# Patient Record
Sex: Female | Born: 1970 | Race: Black or African American | Hispanic: No | Marital: Married | Smoking: Former smoker
Health system: Southern US, Community
[De-identification: ages and names within clinical notes are randomized; demographics above are authoritative.]

## PROBLEM LIST (undated history)

## (undated) DIAGNOSIS — E669 Obesity, unspecified: Secondary | ICD-10-CM

## (undated) DIAGNOSIS — R030 Elevated blood-pressure reading, without diagnosis of hypertension: Secondary | ICD-10-CM

## (undated) DIAGNOSIS — T7840XA Allergy, unspecified, initial encounter: Secondary | ICD-10-CM

## (undated) DIAGNOSIS — F419 Anxiety disorder, unspecified: Secondary | ICD-10-CM

## (undated) DIAGNOSIS — R011 Cardiac murmur, unspecified: Secondary | ICD-10-CM

## (undated) DIAGNOSIS — Z973 Presence of spectacles and contact lenses: Secondary | ICD-10-CM

## (undated) DIAGNOSIS — D219 Benign neoplasm of connective and other soft tissue, unspecified: Secondary | ICD-10-CM

## (undated) HISTORY — PX: ABDOMINAL HYSTERECTOMY: SHX81

## (undated) HISTORY — DX: Elevated blood-pressure reading, without diagnosis of hypertension: R03.0

## (undated) HISTORY — DX: Allergy, unspecified, initial encounter: T78.40XA

## (undated) HISTORY — DX: Anxiety disorder, unspecified: F41.9

## (undated) HISTORY — PX: POLYPECTOMY: SHX149

## (undated) HISTORY — PX: COLONOSCOPY: SHX174

## (undated) HISTORY — DX: Obesity, unspecified: E66.9

## (undated) HISTORY — PX: TONSILLECTOMY AND ADENOIDECTOMY: SUR1326

---

## 1997-08-05 ENCOUNTER — Emergency Department (HOSPITAL_COMMUNITY): Admission: EM | Admit: 1997-08-05 | Discharge: 1997-08-05 | Payer: Self-pay | Admitting: Emergency Medicine

## 1998-01-13 ENCOUNTER — Encounter: Admission: RE | Admit: 1998-01-13 | Discharge: 1998-01-13 | Payer: Self-pay | Admitting: Family Medicine

## 1998-06-13 ENCOUNTER — Encounter: Admission: RE | Admit: 1998-06-13 | Discharge: 1998-06-13 | Payer: Self-pay | Admitting: Family Medicine

## 1998-12-19 ENCOUNTER — Encounter: Admission: RE | Admit: 1998-12-19 | Discharge: 1998-12-19 | Payer: Self-pay | Admitting: Family Medicine

## 1999-01-23 ENCOUNTER — Encounter: Admission: RE | Admit: 1999-01-23 | Discharge: 1999-01-23 | Payer: Self-pay | Admitting: Family Medicine

## 1999-11-16 ENCOUNTER — Encounter: Admission: RE | Admit: 1999-11-16 | Discharge: 1999-11-16 | Payer: Self-pay | Admitting: Family Medicine

## 1999-12-11 ENCOUNTER — Encounter (INDEPENDENT_AMBULATORY_CARE_PROVIDER_SITE_OTHER): Payer: Self-pay | Admitting: *Deleted

## 1999-12-11 LAB — CONVERTED CEMR LAB

## 2001-10-14 ENCOUNTER — Other Ambulatory Visit: Admission: RE | Admit: 2001-10-14 | Discharge: 2001-10-14 | Payer: Self-pay | Admitting: Obstetrics and Gynecology

## 2001-12-02 ENCOUNTER — Emergency Department (HOSPITAL_COMMUNITY): Admission: EM | Admit: 2001-12-02 | Discharge: 2001-12-02 | Payer: Self-pay | Admitting: *Deleted

## 2001-12-02 ENCOUNTER — Encounter: Payer: Self-pay | Admitting: *Deleted

## 2002-04-01 ENCOUNTER — Encounter: Admission: RE | Admit: 2002-04-01 | Discharge: 2002-04-01 | Payer: Self-pay | Admitting: Family Medicine

## 2003-01-26 ENCOUNTER — Other Ambulatory Visit: Admission: RE | Admit: 2003-01-26 | Discharge: 2003-01-26 | Payer: Self-pay | Admitting: Obstetrics and Gynecology

## 2003-03-31 ENCOUNTER — Encounter: Admission: RE | Admit: 2003-03-31 | Discharge: 2003-03-31 | Payer: Self-pay | Admitting: Family Medicine

## 2004-06-26 ENCOUNTER — Ambulatory Visit: Payer: Self-pay | Admitting: Family Medicine

## 2004-06-26 ENCOUNTER — Inpatient Hospital Stay (HOSPITAL_COMMUNITY): Admission: AD | Admit: 2004-06-26 | Discharge: 2004-06-26 | Payer: Self-pay | Admitting: Obstetrics and Gynecology

## 2005-07-17 ENCOUNTER — Other Ambulatory Visit: Admission: RE | Admit: 2005-07-17 | Discharge: 2005-07-17 | Payer: Self-pay | Admitting: Obstetrics and Gynecology

## 2005-09-30 ENCOUNTER — Emergency Department (HOSPITAL_COMMUNITY): Admission: EM | Admit: 2005-09-30 | Discharge: 2005-09-30 | Payer: Self-pay | Admitting: Family Medicine

## 2006-05-09 DIAGNOSIS — L732 Hidradenitis suppurativa: Secondary | ICD-10-CM | POA: Insufficient documentation

## 2006-05-09 DIAGNOSIS — F17200 Nicotine dependence, unspecified, uncomplicated: Secondary | ICD-10-CM | POA: Insufficient documentation

## 2006-05-10 ENCOUNTER — Encounter (INDEPENDENT_AMBULATORY_CARE_PROVIDER_SITE_OTHER): Payer: Self-pay | Admitting: *Deleted

## 2006-10-02 ENCOUNTER — Other Ambulatory Visit: Admission: RE | Admit: 2006-10-02 | Discharge: 2006-10-02 | Payer: Self-pay | Admitting: Obstetrics and Gynecology

## 2008-01-20 ENCOUNTER — Ambulatory Visit: Payer: Self-pay | Admitting: Family Medicine

## 2008-01-20 ENCOUNTER — Telehealth (INDEPENDENT_AMBULATORY_CARE_PROVIDER_SITE_OTHER): Payer: Self-pay | Admitting: *Deleted

## 2008-01-20 DIAGNOSIS — R030 Elevated blood-pressure reading, without diagnosis of hypertension: Secondary | ICD-10-CM | POA: Insufficient documentation

## 2008-01-20 LAB — CONVERTED CEMR LAB
Blood in Urine, dipstick: NEGATIVE
Hemoglobin: 12.7 g/dL
Ketones, urine, test strip: NEGATIVE
Nitrite: NEGATIVE
Specific Gravity, Urine: 1.025
pH: 6

## 2008-02-24 ENCOUNTER — Ambulatory Visit: Payer: Self-pay | Admitting: Family Medicine

## 2008-02-24 DIAGNOSIS — E669 Obesity, unspecified: Secondary | ICD-10-CM | POA: Insufficient documentation

## 2008-05-04 ENCOUNTER — Telehealth (INDEPENDENT_AMBULATORY_CARE_PROVIDER_SITE_OTHER): Payer: Self-pay | Admitting: *Deleted

## 2008-05-20 ENCOUNTER — Ambulatory Visit: Payer: Self-pay | Admitting: Gastroenterology

## 2008-05-24 ENCOUNTER — Telehealth: Payer: Self-pay | Admitting: Gastroenterology

## 2008-05-26 ENCOUNTER — Ambulatory Visit: Payer: Self-pay | Admitting: Gastroenterology

## 2008-05-26 ENCOUNTER — Encounter: Payer: Self-pay | Admitting: Gastroenterology

## 2008-05-26 ENCOUNTER — Ambulatory Visit (HOSPITAL_COMMUNITY): Admission: RE | Admit: 2008-05-26 | Discharge: 2008-05-26 | Payer: Self-pay | Admitting: Gastroenterology

## 2008-05-27 ENCOUNTER — Encounter: Payer: Self-pay | Admitting: Gastroenterology

## 2008-06-14 ENCOUNTER — Ambulatory Visit: Payer: Self-pay | Admitting: Family Medicine

## 2008-06-14 ENCOUNTER — Encounter (INDEPENDENT_AMBULATORY_CARE_PROVIDER_SITE_OTHER): Payer: Self-pay | Admitting: Family Medicine

## 2008-06-14 LAB — CONVERTED CEMR LAB
HIV-1 antibody: NEGATIVE
HIV-2 Ab: NEGATIVE

## 2008-06-18 ENCOUNTER — Encounter (INDEPENDENT_AMBULATORY_CARE_PROVIDER_SITE_OTHER): Payer: Self-pay | Admitting: Family Medicine

## 2010-01-16 ENCOUNTER — Encounter: Payer: Self-pay | Admitting: Sports Medicine

## 2010-04-11 NOTE — Miscellaneous (Signed)
  Clinical Lists Changes  Problems: Removed problem of VAGINAL DISCHARGE (ICD-623.5) Removed problem of SCREENING FOR MALIGNANT NEOPLASM OF THE CERVIX (ICD-V76.2) Removed problem of CONSTIPATION (ICD-564.00) Removed problem of BLOOD IN STOOL (ICD-578.1) Removed problem of RECTAL BLEEDING (ICD-569.3) Removed problem of URINARY FREQUENCY (ICD-788.41)

## 2010-06-01 ENCOUNTER — Encounter: Payer: Self-pay | Admitting: Gastroenterology

## 2010-06-08 NOTE — Letter (Signed)
Summary: Colonoscopy Letter  [facility]  [address] [place], [place] [address]   Phone: [phone]  Fax: [phone]      June 01, 2010 MRN: [id number]   Jenny Morales [address] [place], [place]  [address]   Dear Ms. Janco,   According to your medical record, it is time for you to schedule a Colonoscopy. The American Cancer Society recommends this procedure as a method to detect early colon cancer. Patients with a family history of colon cancer, or a personal history of colon polyps or inflammatory bowel disease are at increased risk.  This letter has been generated based on the recommendations made at the time of your procedure. If you feel that in your particular situation this may no longer apply, please contact our office.  Please call our office at [phone] to schedule this appointment or to update your records at your earliest convenience.  Thank you for cooperating with [place] to provide you with the very best care possible.   Sincerely,  Judie Petit T. Russella Dar, M.D.  [facility] [phone]

## 2010-08-19 ENCOUNTER — Ambulatory Visit (INDEPENDENT_AMBULATORY_CARE_PROVIDER_SITE_OTHER): Payer: Self-pay

## 2010-08-19 ENCOUNTER — Inpatient Hospital Stay (INDEPENDENT_AMBULATORY_CARE_PROVIDER_SITE_OTHER)
Admission: RE | Admit: 2010-08-19 | Discharge: 2010-08-19 | Disposition: A | Discharge status: Home / Self Care | Payer: Self-pay | Source: Ambulatory Visit | Attending: Emergency Medicine | Admitting: Emergency Medicine

## 2010-08-19 DIAGNOSIS — M25469 Effusion, unspecified knee: Secondary | ICD-10-CM

## 2011-02-14 ENCOUNTER — Encounter: Payer: Self-pay | Admitting: Family Medicine

## 2011-04-03 ENCOUNTER — Ambulatory Visit (INDEPENDENT_AMBULATORY_CARE_PROVIDER_SITE_OTHER): Payer: PRIVATE HEALTH INSURANCE | Admitting: Family Medicine

## 2011-04-03 ENCOUNTER — Encounter: Payer: Self-pay | Admitting: Family Medicine

## 2011-04-03 VITALS — BP 122/82 | HR 66 | Temp 97.9°F | Ht 66.0 in | Wt 245.2 lb

## 2011-04-03 DIAGNOSIS — H1130 Conjunctival hemorrhage, unspecified eye: Secondary | ICD-10-CM

## 2011-04-03 DIAGNOSIS — H1132 Conjunctival hemorrhage, left eye: Secondary | ICD-10-CM

## 2011-04-03 HISTORY — DX: Conjunctival hemorrhage, left eye: H11.32

## 2011-04-03 NOTE — Assessment & Plan Note (Signed)
Likely related to eye strain. No inciting events. She states she does have a new prescription but has not yet purchased a new glasses. She also states she is suffering from increased stress from holidays in the past few weeks. If her information regarding subconjunctival hemorrhage and also reasons to followup if condition worsening or not improving.   Reassured her that her blood pressure is normal and that this was likely not cause for her hemorrhage.

## 2011-04-03 NOTE — Progress Notes (Signed)
  Subjective:    Patient ID: Jenny Morales, female    DOB: 1970-07-14, 41 y.o.   MRN: [id number]  HPI #1. Blood shot left eye: Patient awoke 2 days ago with bloodshot portion of lateral aspect of left eye. This happened to her also 3 weeks ago in the past. She denies actual eye pain but does state she has a little dryness over the bloodshot area. No changes in vision. No blurred vision. She denies any recent coughing, vomiting. No recent illnesses. No actual trauma to the area. When this occurred before it resolved after 2-3 days. She is most worried that she is having episode of high blood pressure and this has caused the bleeding in her eye.   Review of Systems See HPI above for review of systems.       Objective:   Physical Exam  Gen:  Alert, cooperative patient who appears stated age in no acute distress.  Vital signs reviewed. Eyes:  Right eye WNL.  Left eye with area of subconjunctival hemorrhage noted lateral aspect of Left eye in sclera, does not extend into iris or pupil.  PERRL, EOMI.  No hyphema      Assessment & Plan:

## 2011-04-03 NOTE — Patient Instructions (Signed)
Subconjunctival Hemorrhage A subconjunctival hemorrhage is a bright red patch covering a portion of the white of the eye. The white part of the eye is called the sclera, and it is covered by a thin membrane called the conjunctiva. This membrane is clear, except for tiny blood vessels that you can see with the naked eye. When your eye is irritated or inflamed and becomes red, it is because the vessels in the conjunctiva are swollen. Sometimes, a blood vessel in the conjunctiva can break and bleed. When this occurs, the blood builds up between the conjunctiva and the sclera, and spreads out to create a red area. The red spot may be very small at first. It may then spread to cover a larger part of the surface of the eye, or even all of the visible white part of the eye. In almost all cases, the blood will go away and the eye will become white again. Before completely dissolving, however, the red area may spread. It may also become brownish-yellow in color, before going away. If a lot of blood collects under the conjunctiva, it may look like a bulge on the surface of the eye. This looks scary, but it will also eventually flatten out and go away. Subconjunctival hemorrhages do not cause pain, but if swollen, may cause a feeling of irritation. There is no effect on vision.  CAUSES   The most common cause is mild trauma (rubbing the eye, irritation).   Subconjunctival hemorrhages can happen because of coughing or straining (lifting heavy objects), vomiting, or sneezing.   In some cases, your doctor may want to check your blood pressure. High blood pressure can also cause a sunconjunctival hemorrhage.   Severe trauma or blunt injuries.   Diseases that affect blood clotting (hemophilia, leukemia).   Abnormalities of blood vessels behind the eye (carotid cavernous sinus fistula).   Tumors behind the eye.   Certain drugs (aspirin, coumadin, heparin).   Recent eye surgery.  HOME CARE INSTRUCTIONS   Do  not worry about the appearance of your eye. You may continue your usual activities.   Often, follow-up is not necessary.  SEEK MEDICAL CARE IF:   Your eye becomes painful.   The bleeding does not disappear within 3 weeks.   Bleeding occurs elsewhere, for example, under the skin, in the mouth, or in the other eye.   You have recurring subconjunctival hemorrhages.  SEEK IMMEDIATE MEDICAL CARE IF:   Your vision changes or you have difficulty seeing.   You develop severe headache, persistent vomiting, confusion, or abnormal drowsiness (lethargy).   Your eye seems to bulge or protrude from the eye socket.   You notice the sudden appearance of bruises, or have spontaneous bleeding elsewhere on your body.  Document Released: 02/26/2005 Document Revised: 11/08/2010 Document Reviewed: 01/24/2009 ExitCare Patient Information 2012 [employer]. 

## 2011-11-28 ENCOUNTER — Encounter: Payer: Self-pay | Admitting: Gastroenterology

## 2013-03-30 ENCOUNTER — Ambulatory Visit (INDEPENDENT_AMBULATORY_CARE_PROVIDER_SITE_OTHER): Payer: BC Managed Care – PPO | Admitting: Family Medicine

## 2013-03-30 ENCOUNTER — Encounter: Payer: Self-pay | Admitting: Family Medicine

## 2013-03-30 VITALS — BP 133/80 | HR 66 | Temp 98.2°F | Ht 66.0 in | Wt 246.0 lb

## 2013-03-30 DIAGNOSIS — R519 Headache, unspecified: Secondary | ICD-10-CM | POA: Insufficient documentation

## 2013-03-30 DIAGNOSIS — R51 Headache: Secondary | ICD-10-CM

## 2013-03-30 NOTE — Patient Instructions (Addendum)
Jenny Morales,  Thank you for coming in today. Your blood pressure is normal in the office. Your neurological exam is also normal.  For your headache, drink plenty of water today. Avoid salt. You may try a dose or two of aleve.  To maintain a normal blood pressure  I recommend continued weight loss and a low salt diet.   F/u with Dr. Awanda Mink for a yearly physical at your convenience.   Dr. Adrian Blackwater   1.5 Gram Low Sodium Diet A 1.5 gram sodium diet restricts the amount of salt in your diet. You can have no more than 1.5 grams (1500 miligrams) in 1 day. This can help lessen your risk for developing high blood pressure. This diet may also reduce your chance of having a heart attack or stroke. It is important that you know what to look for when choosing foods and drinks.  HOME CARE   Do not add salt to food.  Avoid convenience items and fast food.  Choose unsalted snack foods.  Buy products labeled "low sodium" or "no salt added" when possible.  Check food labels to learn how much sodium is in 1 serving.  When eating at a restaurant, ask that your food be prepared with less salt or none, if possible. The nutrition facts label is a good place to find how much sodium is in foods. Look for products with no more than 400 mg of sodium per serving. Remember that 1.5 g = 1500 mg. CHOOSING FOODS Grains  Avoid: Salted crackers and snack items. Some cereals, including instant hot cereals. Bread stuffing and biscuit mixes. Seasoned rice or pasta mixes.  Choose: Unsalted snack items. Low-sodium cereals, oats, puffed wheat and rice, shredded wheat. English muffins and bread. Pasta. Meats  Avoid:  Salted, canned, smoked, spiced, pickled meats, including fish and poultry. Bacon, ham, sausage, cold cuts, hot dogs, anchovies.  Choose: Low-sodium canned tuna and salmon. Fresh or frozen meat, poultry, and fish. Dairy  Avoid: Processed cheese and spreads. Cottage cheese. Buttermilk and condensed milk.  Regular cheese.  Choose:  Milk. Low-sodium cottage cheese. Yogurt. Sour cream. Low-sodium cheese. Fruits and Vegetables  Avoid:  Regular canned vegetables. Regular canned tomato sauce and paste. Frozen vegetables in sauces. Olives. [name]. Relishes. Sauerkraut.  Choose:  Low-sodium canned vegetables. Low-sodium tomato sauce and paste. Frozen or fresh vegetables. Fresh and frozen fruit. Condiments  Avoid:  Canned and packaged gravies. Worcestershire sauce. Tartar sauce. Barbecue sauce. Soy sauce. Steak sauce. Ketchup. Onion, garlic, and table salt. Meat flavorings and tenderizers.  Choose:  Fresh and dried herbs and spices. Low-sodium varieties of mustard and ketchup. Lemon juice. Tabasco sauce. Horseradish. Document Released: 03/31/2010 Document Revised: 05/21/2011 Document Reviewed: 03/31/2010 [facility] Patient Information 2014 [place], [place].

## 2013-03-30 NOTE — Progress Notes (Addendum)
   Subjective:    Patient ID: Jenny Morales, female    DOB: [birth year], 43 y.o.   MRN: [id number]  HPI 43 yo F presents for same day visit:  1. Headache: woke up yesterday with HA. B/l temple, throbbing and tight. No head trauma. Drank excessively 3 nights ago. Pain was better after drinking water yesterday. Pain worsened after eating gumbo. Patient is not prone to HA. Took Goody powder x 2 w/o relief. Denies fever, vision change, nausea.   Admits to feeling a little dizzy (like swaying) and checked BP at [facility] today and it was 155/103.   Soc Hx: ex-smoker.  Fam Hx: patient mother passed away last year, age 34, multiple myeloma, since then patient worries about her own health.  Review of Systems As per HPI     Objective:   Physical Exam BP 133/80  Pulse 66  Temp(Src) 98.2 F (36.8 C) (Oral)  Ht $R'5\' 6"'Mc$  (1.676 m)  Wt 246 lb (111.585 kg)  BMI 39.72 kg/m2 BP Readings from Last 3 Encounters:  03/30/13 133/80  04/03/11 122/82  06/14/08 123/79   Wt Readings from Last 3 Encounters:  03/30/13 246 lb (111.585 kg)  04/03/11 245 lb 3.2 oz (111.222 kg)  06/14/08 235 lb 14.4 oz (107.004 kg)    General appearance: alert, cooperative, no distress and moderately obese Head: Normocephalic, without obvious abnormality, atraumatic Eyes: conjunctivae/corneas clear. PERRL, EOM's intact. Fundi benign. Ears: normal TM's and external ear canals both ears, moderate cerumen irrigated today  Lungs: clear to auscultation bilaterally Heart: regular rate and rhythm, S1, S2 normal, no murmur, click, rub or gallop Neurologic: Alert and oriented X 3, normal strength and tone. Normal symmetric reflexes. Normal coordination and gait      Assessment & Plan:

## 2013-03-30 NOTE — Assessment & Plan Note (Addendum)
A: exam consistent with tension HA brought on by excessive alcohol intake resulting in dehydration, exacerbated by salty food. Normal in office BP.  P:  Your blood pressure is normal in the office. Your neurological exam is also normal.  For your headache, drink plenty of water today. Avoid salt. You may try a dose or two of aleve.  To maintain a normal blood pressure  I recommend continued weight loss and a low salt diet.   F/u with Dr. Awanda Mink for a yearly physical at your convenience.

## 2014-12-17 ENCOUNTER — Ambulatory Visit (INDEPENDENT_AMBULATORY_CARE_PROVIDER_SITE_OTHER): Payer: 59 | Admitting: Family Medicine

## 2014-12-17 ENCOUNTER — Encounter: Payer: Self-pay | Admitting: Family Medicine

## 2014-12-17 VITALS — BP 132/80 | HR 71 | Temp 98.3°F | Ht 66.0 in | Wt 240.3 lb

## 2014-12-17 DIAGNOSIS — E669 Obesity, unspecified: Secondary | ICD-10-CM | POA: Diagnosis not present

## 2014-12-17 DIAGNOSIS — L74512 Primary focal hyperhidrosis, palms: Secondary | ICD-10-CM

## 2014-12-17 DIAGNOSIS — N3941 Urge incontinence: Secondary | ICD-10-CM | POA: Diagnosis not present

## 2014-12-17 DIAGNOSIS — L74513 Primary focal hyperhidrosis, soles: Secondary | ICD-10-CM

## 2014-12-17 LAB — POCT URINALYSIS DIPSTICK
BILIRUBIN UA: NEGATIVE
Glucose, UA: NEGATIVE
KETONES UA: NEGATIVE
LEUKOCYTES UA: NEGATIVE
Nitrite, UA: NEGATIVE
PH UA: 5.5
Protein, UA: NEGATIVE
SPEC GRAV UA: 1.025
Urobilinogen, UA: 0.2

## 2014-12-17 NOTE — Patient Instructions (Addendum)
Thank you for coming in today it was so nice meeting you!   Today you came in for a general physical exam. You received a pap smear and a urine test. We also talked about some urinary incontinence. I think you have something called urge incontinence. We talked about scheduling bathroom breaks to pee. Try about every 2-3 hours to go to the bathroom, even if you do not feel the need to do so. Take time to empty your bladder completely. After urinating, wait a minute. Then try to urinate again.  I will call you with the results from your urine test and your pap smear.   If your urinary symptoms do not improve, please schedule an appointment at the clinic and we can discuss other options for treatment.   If you have any questions or concerns, please do not hesitate to call the office at [phone].  Sincerely,  Smitty Cords, MD     Urinary Incontinence Urinary incontinence is the involuntary loss of urine from your bladder.  SIGNS AND SYMPTOMS Urinary Incontinence can be divided into four types: 1. Urge incontinence. Urge incontinence is the involuntary loss of urine before you have the opportunity to go to the bathroom. There is a sudden urge to void but not enough time to reach a bathroom. 2. Stress incontinence. Stress incontinence is the sudden loss of urine with any activity that forces urine to pass. It is commonly caused by anatomical changes to the pelvis and sphincter areas of your body. 3. Overflow incontinence. Overflow incontinence is the loss of urine from an obstructed opening to your bladder. This results in a backup of urine and a resultant buildup of pressure within the bladder. When the pressure within the bladder exceeds the closing pressure of the sphincter, the urine overflows, which causes incontinence, similar to water overflowing a dam. 4. Total incontinence. Total incontinence is the loss of urine as a result of the inability to store urine within your  bladder. HOME CARE INSTRUCTIONS  Normal daily hygiene and the use of pads or adult diapers that are changed regularly will help prevent odors and skin damage.  Avoid caffeine. It can overstimulate your bladder.  Use the bathroom regularly. Try about every 2-3 hours to go to the bathroom, even if you do not feel the need to do so. Take time to empty your bladder completely. After urinating, wait a minute. Then try to urinate again.  For causes involving nerve dysfunction, keep a log of the medicines you take and a journal of the times you go to the bathroom. SEEK MEDICAL CARE IF:  You experience worsening of pain instead of improvement in pain after your procedure.  Your incontinence becomes worse instead of better. SEE IMMEDIATE MEDICAL CARE IF:  You experience fever or shaking chills.  You are unable to pass your urine.  You have redness spreading into your groin or down into your thighs. MAKE SURE YOU:   Understand these instructions.   Will watch your condition.  Will get help right away if you are not doing well or get worse.   This information is not intended to replace advice given to you by your health care provider. Make sure you discuss any questions you have with your health care provider.   Document Released: 04/05/2004 Document Revised: 03/19/2014 Document Reviewed: 08/05/2012 Elsevier Interactive Patient Education [employer].

## 2014-12-17 NOTE — Progress Notes (Signed)
Patient ID: NANETTE WIRSING , female   DOB: [birth year] , 44 y.o..   MRN: [id number]  Subjective:   RHONNA HOLSTER is a 44 y.o. year old female who presents to office today for an annual physical examination.  Concerns today include:  1. Urinary incontinence:  For the past 3 months the patient states that she has been having some urinary incontinence. Whenever she feels the urge to urinate, some urine leaks out before she can reach the toilet. She has never had this problem before. No urine leakage associated with sneezing or coughing. She has never been pregnant before. Denies fever, dysuria, hematuria, increased frequency, pelvic or abdominal pain.    Review of Systems Eyes: positive for contacts/glasses, negative for visual disturbance Genitourinary:positive for urinary incontinence, negative for abnormal menstrual periods, genital lesions, hot flashes and vaginal discharge, decreased stream, dysuria, frequency, hematuria, hesitancy and nocturia   Review of Systems: Per HPI. All other systems reviewed and are negative.  Health Maintenance Due  Topic Date Due  . PAP SMEAR  12/11/2002  . INFLUENZA VACCINE  10/11/2014   Past Medical History: Patient Active Problem List   Diagnosis Date Noted  . Urinary incontinence 12/18/2014  . 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 OBESITY 02/24/2008   Social History:  reports that she has quit smoking. She does not have any smokeless tobacco history on file. Quit smoking in 2011, does admit to marijuana use occasionally. Drinks 2-3 alcoholic drinks/month.  Medications: reviewed and updated Current Outpatient Prescriptions  Medication Sig Dispense Refill  . multivitamin (THERAGRAN) per tablet Take 1 tablet by mouth daily.       No current facility-administered medications for this visit.    Objective:  BP 132/80 mmHg  Pulse 71  Temp(Src) 98.3 F (36.8 C) (Oral)  Ht 5\' 6"  (1.676 m)  Wt 240 lb 4.8 oz (108.999 kg)  BMI 38.80  kg/m2  LMP 12/07/2014 Physical Exam  General appearance: alert, cooperative, appears stated age, no distress and obese HEENT: Normocephalic, without obvious abnormality, atraumatic. conjunctivae/corneas clear. PERRL, EOM's intact. normal TM's and external ear canals both ears. Nares normal. Septum midline. Mucosa normal. No drainage or sinus tenderness. lips, mucosa, and tongue normal; teeth and gums normal Neck: no adenopathy, no carotid bruit, no JVD, supple, symmetrical, trachea midline and thyroid not enlarged, symmetric, no tenderness/mass/nodules Back: symmetric, no curvature. ROM normal. No CVA tenderness. Lungs: clear to auscultation bilaterally Breasts: deferred Heart: regular rate and rhythm, S1, S2 normal, no murmur, click, rub or gallop Abdomen: soft, non-tender; bowel sounds normal; no masses,  no organomegaly Pelvic: external genitalia normal, no adnexal masses or tenderness, no bladder tenderness, no cervical motion tenderness, positive findings: Nabothian cyst, vagina normal without discharge Extremities: extremities normal, atraumatic, no cyanosis or edema and varicose veins noted Pulses: 2+ and symmetric Skin: Skin color, texture, turgor normal. No rashes or lesions. Hyperhidrosis of palms and soles Neurologic: Alert and oriented X 3, normal strength and tone. Normal symmetric reflexes. 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 No treatment at this time  OBESITY BMI 38.8. Discussed importance of healthy lifestyle with patient. She is actually doing very well, she has lost 15 lbs in the last 2 months by exercising and eating less carbohydrates. Will continue to monitor weight and provide support.   Urinary incontinence Likely urge incontinence. Will treat conservatively for now. Patient was given instructions on having scheduled bathroom breaks rather than waiting  until her bladder becomes full.  She will try to urinate every 2-3 hours and see if incontinence improves. If note, we will discuss other options at future visit.     Smitty Cords, MD [place], PGY-1

## 2014-12-17 NOTE — Progress Notes (Deleted)
Pediatric Teaching Service Daily Resident Note  Patient name: Jenny Morales Medical record number: [id number] Date of birth: [birth year] Age: 44 y.o. Gender: female Length of Stay: @RRHLOS @  Subjective: ***  Objective:  Vitals:  @VSRANGES @   UOP: *** ml/kg/hr Filed Weights   12/17/14 1518  Weight: 240 lb 4.8 oz (108.999 kg)    Physical exam  General: Well-appearing in NAD.  HEENT: NCAT. PERRL. Nares patent. O/P clear. MMM. Neck: FROM. Supple. Heart: RRR. Nl S1, S2. Femoral pulses nl. CR brisk.  Chest: CTAB. No wheezes/crackles. Abdomen:+BS. S, NTND. No HSM/masses.  Genitalia: {Exam; genital infant:16857} Extremities: WWP. Moves UE/LEs spontaneously.  Musculoskeletal: Nl muscle strength/tone throughout. Neurological: Alert and interactive. Nl reflexes. Skin: No rashes.   Labs: No results found for this or any previous visit (from the past 24 hour(s)).  Micro: ***  Imaging: No results found.  Assessment & Plan: ***   1. *** 2. FEN/GI 3. Social 4. [place] 12/17/2014 4:08 PM

## 2014-12-18 ENCOUNTER — Other Ambulatory Visit (HOSPITAL_COMMUNITY)
Admission: RE | Admit: 2014-12-18 | Discharge: 2014-12-18 | Disposition: A | Discharge status: Home / Self Care | Payer: 59 | Source: Ambulatory Visit | Attending: Family Medicine | Admitting: Family Medicine

## 2014-12-18 DIAGNOSIS — Z1151 Encounter for screening for human papillomavirus (HPV): Secondary | ICD-10-CM | POA: Insufficient documentation

## 2014-12-18 DIAGNOSIS — Z01419 Encounter for gynecological examination (general) (routine) without abnormal findings: Secondary | ICD-10-CM | POA: Insufficient documentation

## 2014-12-18 DIAGNOSIS — R32 Unspecified urinary incontinence: Secondary | ICD-10-CM | POA: Insufficient documentation

## 2014-12-18 DIAGNOSIS — L74512 Primary focal hyperhidrosis, palms: Secondary | ICD-10-CM | POA: Insufficient documentation

## 2014-12-18 DIAGNOSIS — L74513 Primary focal hyperhidrosis, soles: Secondary | ICD-10-CM

## 2014-12-18 NOTE — Assessment & Plan Note (Addendum)
Chronic but stable issue for patient. No treatment at this time

## 2014-12-18 NOTE — Assessment & Plan Note (Signed)
Likely urge incontinence. Will treat conservatively for now. Patient was given instructions on having scheduled bathroom breaks rather than waiting until her bladder becomes full. She will try to urinate every 2-3 hours and see if incontinence improves. If note, we will discuss other options at future visit.

## 2014-12-18 NOTE — Assessment & Plan Note (Signed)
BMI 38.8. Discussed importance of healthy lifestyle with patient. She is actually doing very well, she has lost 15 lbs in the last 2 months by exercising and eating less carbohydrates. Will continue to monitor weight and provide support.

## 2014-12-21 LAB — CYTOLOGY - PAP

## 2014-12-22 ENCOUNTER — Encounter: Payer: Self-pay | Admitting: Family Medicine

## 2014-12-22 ENCOUNTER — Telehealth: Payer: Self-pay | Admitting: Family Medicine

## 2014-12-24 NOTE — Telephone Encounter (Signed)
Called patient and informed her of negative Pap smear

## 2016-04-23 ENCOUNTER — Ambulatory Visit (INDEPENDENT_AMBULATORY_CARE_PROVIDER_SITE_OTHER): Payer: BLUE CROSS/BLUE SHIELD | Admitting: Internal Medicine

## 2016-04-23 ENCOUNTER — Encounter: Payer: Self-pay | Admitting: Internal Medicine

## 2016-04-23 DIAGNOSIS — R21 Rash and other nonspecific skin eruption: Secondary | ICD-10-CM

## 2016-04-23 NOTE — Assessment & Plan Note (Signed)
Unsure of etiology. Possibly allergic dermatitis, however non-pruritic and no obvious allergen. Affected skin dry but likely 2/2 application of rubbing alcohol rather than condition itself. Although dry, affected skin not consistent in appearance with psoriasis, and location of rash would be unusual for psoriasis. Will attempt trial of steroids and reassess if no improvement. Not consistent in appearance with yeast or fungal infection but could consider scrape if no improvement with steroids. No signs of infection indicating need for antibiotics.  - Hydrocortisone 1% BID mixed with Vaseline - Can apply Vaseline as many times as desired daily to improve dry skin - Return if no improvement after 3-4 weeks

## 2016-04-23 NOTE — Patient Instructions (Signed)
It was nice meeting you today Ms. Jenny Morales!  You can buy 1% hydrocortisone cream at the drugstore and begin applying this to your face twice a day. You can apply it directly to your skin, but since your skin has been dry, I would mix it in with Vaseline. You can apply Vaseline to your skin as many times a day as you want for dryness.   If the rash is not getting better in about a month, please schedule another appointment and we can consider a stronger steroid cream or a different type of medication.   If you have any questions or concerns, please feel free to call the clinic.   Be well,  Dr. Avon Gully

## 2016-04-23 NOTE — Progress Notes (Signed)
   Subjective:   Patient: Jenny Morales       Birthdate: 04/05/1970       MRN: [id number]      HPI  SHETARA BERROA is a 46 y.o. female presenting for same day appointment for rash on face.   First noticed six months ago. Present primarily around mouth, but under nose as well. Improves some days but then returns. Has not worsened over past six months but has not improved either. Does not itch and is not painful. Is sometimes erythematous. Patient has been applying rubbing alcohol to the affected area, but recently stopped because this has made her skin dry and scaly. She also puts shea butter on her face. She has not noticed a rash anywhere else. She has seasonal allergies but no allergies otherwise. She has not started using any new facial products.   Smoking status reviewed. Patient is former smoker.   Review of Systems See HPI.     Objective:  Physical Exam  Constitutional: She is oriented to person, place, and time and well-developed, well-nourished, and in no distress.  HENT:  Head: Normocephalic and atraumatic.  Mouth/Throat: Oropharynx is clear and moist. No oropharyngeal exudate.  No oral lesions  Eyes: Conjunctivae and EOM are normal. Right eye exhibits no discharge. Left eye exhibits no discharge.  Pulmonary/Chest: Effort normal. No respiratory distress.  Neurological: She is alert and oriented to person, place, and time.  Skin:  Flesh-colored papular rash surrounding lower lip. Few similar lesions under nose. Affected skin dry and scaly but without flaking. No erythema. No rashes noted elsewhere.   Psychiatric: Affect and judgment normal.     Assessment & Plan:  Rash of face Unsure of etiology. Possibly allergic dermatitis, however non-pruritic and no obvious allergen. Affected skin dry but likely 2/2 application of rubbing alcohol rather than condition itself. Although dry, affected skin not consistent in appearance with psoriasis, and location of rash would be  unusual for psoriasis. Will attempt trial of steroids and reassess if no improvement. Not consistent in appearance with yeast or fungal infection but could consider scrape if no improvement with steroids. No signs of infection indicating need for antibiotics.  - Hydrocortisone 1% BID mixed with Vaseline - Can apply Vaseline as many times as desired daily to improve dry skin - Return if no improvement after 3-4 weeks  Adin Hector, MD, MPH PGY-2 [facility] Pager [phone]

## 2016-08-08 ENCOUNTER — Telehealth: Payer: Self-pay | Admitting: Family Medicine

## 2016-08-08 NOTE — Telephone Encounter (Signed)
Pt saw Dr. Avon Gully back in February and was told to use hydrocortisone on her face and if it didn't get better to call back and she would call something stronger in. Pt states the hydrocortisone didn't work. Pt uses [facility] on [address]. ep

## 2016-08-08 NOTE — Telephone Encounter (Signed)
Pt states she can't afford to come in because she has a high deductible. Pt said the doctor she saw would call something in if the cream didn't work. Pt doesn't want a referral for dermatology either. Pt would like someone to call her. ep

## 2016-08-08 NOTE — Telephone Encounter (Signed)
Patient will need to be seen if rash is persistant. She may need a new medication or dermatology referral. [facility] team please call patient and ask her to schedule an appointment with [place]. Thank you.   Smitty Cords, MD [place], PGY-2

## 2016-08-10 NOTE — Telephone Encounter (Signed)
Patient has not been seen in four months for this problem. She needs to schedule another appointment before I will prescribe any additional medications.   Adin Hector, MD, MPH PGY-2 [facility] Pager [phone]

## 2016-08-10 NOTE — Telephone Encounter (Signed)
I have not seen this patient for her rash and will not prescribe another medication without seeing her. Patient did see Dr. Avon Gully last for her rash, will forward to her and see if the patient and her had that conversation.

## 2016-08-23 ENCOUNTER — Telehealth: Payer: Self-pay | Admitting: Family Medicine

## 2016-08-23 NOTE — Telephone Encounter (Signed)
Patient needs referral to dermatologist and has [facility].

## 2016-08-24 NOTE — Telephone Encounter (Signed)
Pt informed of below. Zimmerman Rumple, April D, CMA  

## 2016-08-24 NOTE — Telephone Encounter (Signed)
2nd request. ep °

## 2016-08-24 NOTE — Telephone Encounter (Signed)
Patient does not need a referral from me with her insurance. [facility] team please let patient know. Thank you.   Smitty Cords, MD [place], PGY-2

## 2016-09-20 DIAGNOSIS — L700 Acne vulgaris: Secondary | ICD-10-CM | POA: Diagnosis not present

## 2016-09-20 DIAGNOSIS — L74510 Primary focal hyperhidrosis, axilla: Secondary | ICD-10-CM | POA: Diagnosis not present

## 2016-11-30 ENCOUNTER — Ambulatory Visit (INDEPENDENT_AMBULATORY_CARE_PROVIDER_SITE_OTHER): Payer: BLUE CROSS/BLUE SHIELD | Admitting: Family Medicine

## 2016-11-30 ENCOUNTER — Encounter: Payer: Self-pay | Admitting: Psychology

## 2016-11-30 ENCOUNTER — Ambulatory Visit (HOSPITAL_COMMUNITY)
Admission: RE | Admit: 2016-11-30 | Discharge: 2016-11-30 | Disposition: A | Discharge status: Home / Self Care | Payer: BLUE CROSS/BLUE SHIELD | Source: Ambulatory Visit | Attending: Family Medicine | Admitting: Family Medicine

## 2016-11-30 VITALS — BP 148/90 | HR 72 | Temp 98.2°F | Wt 227.0 lb

## 2016-11-30 DIAGNOSIS — R42 Dizziness and giddiness: Secondary | ICD-10-CM

## 2016-11-30 DIAGNOSIS — R002 Palpitations: Secondary | ICD-10-CM | POA: Diagnosis not present

## 2016-11-30 DIAGNOSIS — F411 Generalized anxiety disorder: Secondary | ICD-10-CM

## 2016-11-30 DIAGNOSIS — Z87898 Personal history of other specified conditions: Secondary | ICD-10-CM

## 2016-11-30 HISTORY — DX: Personal history of other specified conditions: Z87.898

## 2016-11-30 NOTE — Patient Instructions (Addendum)
It was nice meeting you today! You were seen in clinic for palpitations/dizziness.  Your blood pressure, although high, was not very elevated to be concerning for an acute condition.   I suspect some of your symptoms may be related to underlying anxiety given your recent loss of family members and stress.  However, I have ordered some labs to be thorough and rule out other reasons you could be having these symptoms.  You had an EKG today which was normal.    I would recommend decreasing your salt intake in food at home.  Additionally, make sure you are drinking plenty of fluids to avoid dizziness due to dehydration.  I would also recommend buying a blood pressure monitor for home and keeping track of this.    If you have persistent headaches, notice weakness, sudden changes in vision or very elevated blood pressure, I would urge you to go to the ED as these could all be signs that something more serious is going on.  I will follow up with the results of your bloodwork and call you once I receive them.    Please follow up in 1 week with your PCP.    Be well,  Lovenia Kim, MD

## 2016-11-30 NOTE — Assessment & Plan Note (Signed)
Assessment/Plan Recommendations:  Patient presented with a history of anxiety, which has recently been exacerbated by the death of her two brothers, 1 in February, 1 in July. She reported experiencing period panic attacks, including sweating, trouble breathing, sleeping difficulty and irritability along with uncontrolled worries, which she experiences daily. Her affect was appropriate during the visit and she was forthcoming with information.   [facility] provided psychoeducation about anxiety and panic attacks and introduced deep breathing to patient. Patient practiced deep breathing and received hand-out and will continue to do 2 minutes of deep breathing 2x daily for the next week. She will follow up with [facility] next week.

## 2016-11-30 NOTE — Progress Notes (Signed)
Subjective:   Patient ID: Jenny Morales    DOB: 06-02-1970, 46 y.o. female   MRN: [id number]  CC: palpitations, SOB, dizziness   HPI: Jenny Morales is a 46 y.o. female who presents to clinic today for palpitations and blurry vision.   Palpitations/SOB  Pt was told her blood pressure was high so she checked it at [place] yesterday and it was 153/92.   Does not otherwise check blood pressures at home. She has been having some blurring of her vision which is not a new problem and has been going on for some years now.   She is near-sighted and feels like her eyesight has changed over the past month.  She thought this may be related to her glasses prescription as she has a few different pairs.  Has also been having headaches over past few days which is mostly located on back and sides of her head.  Headaches feel like pressure but is dull and tension-like.  Has not taken medications to relieve.   Denies issues with memory.   Has also been feeling short of breath with exertion.  Uses 1 pillow at bedtime.  Has not noticed any swelling in her legs.  Has been exercising 3-4 days a week over past few months.  Her palpitations have been on and off however she has been experiencing them more this week.   Has been feeling a lot of anxiety lately regarding loss of family and financial stress ever since her mom passed in 2013 from multiple myeloma. Earlier this year she lost her oldest brother in February and her youngest brother in July both due to motorcycle accidents. She lives alone and only living sibling is a brother in [place].  Feels like she has a strong support system of friends and extended family however is preoccupied with the fear of losing other family members. She discusses much of this with her close friend who yesterday recommend she see a mental health doctor for this as well.  She feels alone in her situation despite having people around her and has felt depressed since her mother passed  in 2013.  Has not been on any medications for anxiety or depression.  Has not seen a mental health specialist in the past.  Endorses some feelings of hurting herself in the past however has never had a plan.  No history of prior attempts.  Does not currently have any SI or HI.  She does endorse a history of panic attacks although this is not in her chart, per review.  Feels like she has had similar symptoms of palpitations, shortness of breath and dizziness due to her anxiety level and stress.  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  Has not seen a cardiologist in the past for workup of her palpitations.  Has a history of rheumatic fever as a child however does not recall the details of it. She is a former smoker and quit about 7 years ago.  ROS: Denies fevers, chills, nausea, vomiting, diarrhea.  Denies chest pain.  Positive for palpitations and shortness of breath.   [place]: Pertinent past medical, surgical, family, and social history were reviewed and updated as appropriate. Smoking status reviewed. Medications reviewed.  Objective:   BP (!) 148/90   Pulse 72   Temp 98.2 F (36.8 C) (Oral)   Wt 227 lb (103 kg)   SpO2 97%   BMI 36.64 kg/m  Vitals and nursing note reviewed.  General: 46 yo F, NAD  HEENT: NCAT, MMM, o/p clear  Neck: supple, no JVD  CV: RRR no MRG, palpable pulses  Lungs: CTAB, normal effort, no crackles rales or wheezing appreciated, normal effort  Abdomen: soft, NTND, +bs  Skin: warm, dry, no cyanosis  Extremities: warm, well perfused, no edema or tenderness present  Psych: appears anxious   Assessment & Plan:   Palpitations have been on and off over several years - has not seen cardiology.  No chest pain or red flags on exam to suggest ACS.  Normal EKG in office today.  Shortness of breath is new and may suggest CHF but less likely as no signs of fluid overload on exam and benign  lung exam without crackles.  Suspect underlying anxiety is the cause given multiple stress factors in her life and history of similar feeling during panic attacks, however will order labs to rule out other causes ie anemia, electrolyte disturbances.  Discussed with Verdis Frederickson [facility]) today who met with patient, appreciate BH time and care.  -check CBC and BMET -Advised to limit caffeine and salt intake -Relaxation methods ie deep breathing discussed -Psychoeducation provided by [facility] today -Given strict return precautions  -Pt to follow up in 1 week with PCP and Behavioral Health  Orders Placed This Encounter  Procedures  . CBC  . Basic metabolic panel  . EKG 12-Lead   Follow up: 1 week   Lovenia Kim, MD [place], PGY-2 12/03/2016 9:06 AM

## 2016-11-30 NOTE — Progress Notes (Signed)
Dr. Reesa Chew requested [facility] consult for patient experiencing anxiety and panic attacks.   Presenting Issue: Patient reported experiencing anxiety, which has been exacerbated this year after the death of her two brothers, 1 in February, 1 in July. She reported experiencing a panic attack  a few days ago. She also reported limited social support.   Report of symptoms: She reported uncontrolled worrying, about multiple issues such as her health, her finances and her future, sweating, difficulty breathing, troubles sleeping, and irritability.   Duration of CURRENT symptoms: She reported symptoms began in July 07, 2011 after the death of her mother but have been exacerbated in 07/06/2016.   Age of onset of first mood disturbance: July 07, 2011  Impact on function: She reported her anxiety has not impacted her social interactions, ability to work or concentrate; however, she noted she is having more difficulty controlling her worries.   Psychiatric History - Diagnoses:None - Hospitalizations:  None - Pharmacotherapy: None - Outpatient therapy: None  Family history of psychiatric issues: She thinks mom might have had anxiety but was never diagnosed with anxiety disorder.   Current and history of substance use: Smokes marijuana daily to manage her worry. Drinks about 6 alcoholic beverages on weekends.   GAD7: 18, indicating severe anxiety.   [facility] provided psychoeducation about anxiety and panic attacks and introduced patient to deep breathing. Patient will practice deep breathing 2x daily for 2 minutes and will return to see [facility] in 1 week, September 28th.   Warm hand-off complete.

## 2016-12-01 LAB — CBC
HEMATOCRIT: 40.9 % (ref 34.0–46.6)
Hemoglobin: 13.3 g/dL (ref 11.1–15.9)
MCH: 27 pg (ref 26.6–33.0)
MCHC: 32.5 g/dL (ref 31.5–35.7)
MCV: 83 fL (ref 79–97)
PLATELETS: 349 10*3/uL (ref 150–379)
RBC: 4.92 x10E6/uL (ref 3.77–5.28)
RDW: 15.3 % (ref 12.3–15.4)
WBC: 8.8 10*3/uL (ref 3.4–10.8)

## 2016-12-01 LAB — BASIC METABOLIC PANEL
BUN/Creatinine Ratio: 14 (ref 9–23)
BUN: 10 mg/dL (ref 6–24)
CALCIUM: 9.8 mg/dL (ref 8.7–10.2)
CHLORIDE: 100 mmol/L (ref 96–106)
CO2: 23 mmol/L (ref 20–29)
Creatinine, Ser: 0.72 mg/dL (ref 0.57–1.00)
GFR calc Af Amer: 116 mL/min/{1.73_m2} (ref >59)
GFR, EST NON AFRICAN AMERICAN: 101 mL/min/{1.73_m2} (ref >59)
Glucose: 97 mg/dL (ref 65–99)
POTASSIUM: 3.9 mmol/L (ref 3.5–5.2)
Sodium: 140 mmol/L (ref 134–144)

## 2016-12-03 ENCOUNTER — Encounter: Payer: Self-pay | Admitting: Family Medicine

## 2016-12-07 ENCOUNTER — Ambulatory Visit (INDEPENDENT_AMBULATORY_CARE_PROVIDER_SITE_OTHER): Payer: BLUE CROSS/BLUE SHIELD | Admitting: Psychology

## 2016-12-07 DIAGNOSIS — F411 Generalized anxiety disorder: Secondary | ICD-10-CM

## 2016-12-07 NOTE — Progress Notes (Signed)
Reason for follow-up:  Patient follow-up with [facility] for continued management of anxiety.   Issues discussed:  Patient reported feeling anxious almost daily and trouble controlling her worries across several domains, including her health, her finances, and her relationship status. She reported deep breathing has been helpful in managing her physiological anxiety symptoms, such as heart palpitations; however, has been unhelpful for managing uncontrolled anxious cognitions. Patient noted she has not used marijuana since her last visit because it can exacerbates anxiety symptoms at times.

## 2016-12-07 NOTE — Assessment & Plan Note (Signed)
Assessment/Plan Recommendations:  Patient's PHQ-9 score (5) indicates mild depression. She denied suicidal ideation (item 9); however, she did not complete item 4 (feeling tired) and scores should be interpreted within this context. Per patient, depressive symptoms are not a main concern for her. Her GAD-7 score (15) indicates moderately severe anxiety, though she did not compelte item 6 (easily nnoyed/irritable), this elevated score is consistent with her presentation. Her affect was appropriate during visit; however, on several occasions she became visibly anxious just discussing her health concerns. Overall, she is distressed by her anxiety but is motivated for treatment.   [facility] introduced patient to cognitive distortions and cognitive restructuring  as a way to counter  uncontrolled worried thoughts. During the session, patient was able to identify several cognitive distortions in her thinking and practice challenging these. She will continue to complete a though log and challenge her cognitions over the next two weeks. Additionally, [facility] provided psychoeducation about the role of marijuana on anxiety and its potential to increase anxiety. Patient will continue to abstain from smoking in the next 2 weeks. Patient will return in 2 weeks to see [facility] for follow-up appointment.

## 2016-12-21 ENCOUNTER — Ambulatory Visit: Payer: BLUE CROSS/BLUE SHIELD

## 2018-01-10 ENCOUNTER — Ambulatory Visit (HOSPITAL_COMMUNITY)
Admission: EM | Admit: 2018-01-10 | Discharge: 2018-01-10 | Discharge status: Against Medical Advice | Payer: BLUE CROSS/BLUE SHIELD

## 2018-01-10 ENCOUNTER — Encounter (HOSPITAL_COMMUNITY): Payer: Self-pay | Admitting: Emergency Medicine

## 2018-01-10 ENCOUNTER — Other Ambulatory Visit: Payer: Self-pay

## 2018-01-10 NOTE — ED Notes (Signed)
Pt states she would like to leave and make an appt with her PCP. Pt denies any symptoms. States "you would just send me to my PCP anyway" pt encouraged to come back for any reason or if she experiences any symptoms. Pt agreeable. LWBS

## 2018-01-10 NOTE — ED Triage Notes (Signed)
Patient has checked bp this week and reports reading of 183/102.    denies headache or chest pain.   Complains of knees hurting

## 2018-03-24 ENCOUNTER — Encounter: Payer: Self-pay | Admitting: Family Medicine

## 2018-03-25 ENCOUNTER — Encounter: Payer: Self-pay | Admitting: Family Medicine

## 2018-03-25 ENCOUNTER — Other Ambulatory Visit: Payer: Self-pay

## 2018-03-25 ENCOUNTER — Ambulatory Visit (INDEPENDENT_AMBULATORY_CARE_PROVIDER_SITE_OTHER): Payer: Self-pay | Admitting: Family Medicine

## 2018-03-25 VITALS — BP 124/80 | HR 78 | Temp 98.2°F | Ht 66.0 in | Wt 232.4 lb

## 2018-03-25 DIAGNOSIS — Z0000 Encounter for general adult medical examination without abnormal findings: Secondary | ICD-10-CM

## 2018-03-25 DIAGNOSIS — L0293 Carbuncle, unspecified: Secondary | ICD-10-CM

## 2018-03-25 DIAGNOSIS — R030 Elevated blood-pressure reading, without diagnosis of hypertension: Secondary | ICD-10-CM | POA: Insufficient documentation

## 2018-03-25 DIAGNOSIS — E669 Obesity, unspecified: Secondary | ICD-10-CM

## 2018-03-25 LAB — POCT GLYCOSYLATED HEMOGLOBIN (HGB A1C): HEMOGLOBIN A1C: 5.3 % (ref 4.0–5.6)

## 2018-03-25 MED ORDER — INDAPAMIDE 1.25 MG PO TABS: 1.2500 mg | ORAL_TABLET | Freq: Every day | ORAL | 3 refills | Status: DC

## 2018-03-25 MED ORDER — CHLORHEXIDINE GLUCONATE 4 % EX LIQD: Freq: Every day | CUTANEOUS | 0 refills | Status: DC | PRN

## 2018-03-25 NOTE — Progress Notes (Addendum)
Patient Name: Jenny Morales Date of Birth: November 03, 1970 Date of Visit: 03/25/18 PCP: Martyn Malay, MD  Chief Complaint: annual exam   Subjective: Jenny Morales is a pleasant 48 y.o. year old with a history significant for obesity and anxiety presenting today for an annual exam.  She reports overall she is doing well.  She did have 2 brothers passed away last year and her mother passed away in 06-Jun-2011.  She reports her anxiety is under good control.  She reports that she is not having panic symptoms.  She denies that anxiety is severely affecting her life.  She is able to keep up with her job.  She is in a good relationship currently and feels overall well. The patient does not smoke.  She uses alcohol 1-2 times a month at most and drinks 1-2 beers.  She smokes marijuana on a daily basis.  The patient was recently seen in the emergency room for elevated blood pressure readings.  She denies signs or symptoms of hypertension including vision changes, headache, chest pain, difficulty breathing.  No lower extremity edema.  She has been monitoring her blood pressure and found that usually hovers in the 120s or 130s.  Today she reports she is anxious about her doctor's visit.  Patient reports a history of recurrent boils.  In describing boils she reports that sometimes she has an ingrown hair that become inflamed and subsequently drained.  She is wondering if she has one on her bottom today.  Denies fevers, chills, pain with defecation, pain with urination.  GAD 6  PHQ 9 is 2  ROS:  ROS As above I have reviewed the patient's medical, surgical, family, and social history as appropriate.   Vitals:   03/25/18 1013 03/25/18 1040  BP: (!) 160/88 124/80  Pulse: 78   Temp: 98.2 F (36.8 C)   SpO2: 98%    Filed Weights   03/25/18 1013  Weight: 232 lb 6.4 oz (105.4 kg)  repeat 124/80   HEENT: Sclera anicteric. Dentition is moderate. Appears well hydrated. Neck: Supple Cardiac: Regular  rate and rhythm. Normal S1/S2. No murmurs, rubs, or gallops appreciated. Lungs: Clear bilaterally to ascultation.  Abdomen: Normoactive bowel sounds. No tenderness to deep or light palpation. No rebound or guarding.  Extremities: Warm, well perfused without edema.  Skin: Warm, dry Psych: Pleasant and appropriate  Upon exam: Perineal area with small area of hyperpigmentation along the right medial gluteal area.  No palpable abscess.  No mass, no alkalosis.   [name] was seen today for annual exam.  Diagnoses and all orders for this visit:  Annual physical exam We discussed healthy eating habits, moderate physical activity for 30 minutes 5 times per week (or 150 minutes), safe sex practices, avoiding tobacco products, safe alcohol consumption, and safe driving habits.   Obesity,  -     HgB A1C  Recurrent boils none identified today.  We discussed the option to see general surgery about the recurrence of the abscess in a single area.  Reviewed reasons to return to care including if this recurred or was worsening. -     chlorhexidine (HIBICLENS) 4 % external liquid; Apply topically daily as needed.  Elevated blood pressure reading we discussed starting a medication today for her initially elevated blood pressure reading and prior elevated reading.  Repeat was normal.  The patient is determined to lose weight.  She is already lost 8 pounds.  She is working on limiting her salt intake and reducing  her total calorie intake during the day.  We will follow-up in 3 months time.  She is to monitor her blood pressure and call if readings are persistently greater than 140/90. -     Lipid Panel -     Basic Metabolic Panel  Dorris Singh, MD  [facility] Medicine Teaching Service

## 2018-03-25 NOTE — Patient Instructions (Addendum)
It was wonderful to see you today.  Thank you for choosing [place].   Please call [phone] with any questions about today's appointment.  Please be sure to schedule follow up at the front  desk before you leave today.   Dorris Singh, MD  Family Medicine    Great work with your activity and weight loss

## 2018-03-26 LAB — BASIC METABOLIC PANEL
BUN / CREAT RATIO: 12 (ref 9–23)
BUN: 9 mg/dL (ref 6–24)
CHLORIDE: 102 mmol/L (ref 96–106)
CO2: 21 mmol/L (ref 20–29)
Calcium: 9.5 mg/dL (ref 8.7–10.2)
Creatinine, Ser: 0.74 mg/dL (ref 0.57–1.00)
GFR calc Af Amer: 112 mL/min/{1.73_m2} (ref >59)
GFR calc non Af Amer: 97 mL/min/{1.73_m2} (ref >59)
GLUCOSE: 129 mg/dL — AB (ref 65–99)
Potassium: 3.8 mmol/L (ref 3.5–5.2)
Sodium: 136 mmol/L (ref 134–144)

## 2018-03-26 LAB — LIPID PANEL
Chol/HDL Ratio: 2.5 ratio (ref 0.0–4.4)
Cholesterol, Total: 142 mg/dL (ref 100–199)
HDL: 57 mg/dL (ref >39)
LDL Calculated: 69 mg/dL (ref 0–99)
Triglycerides: 79 mg/dL (ref 0–149)
VLDL Cholesterol Cal: 16 mg/dL (ref 5–40)

## 2018-03-27 ENCOUNTER — Encounter: Payer: Self-pay | Admitting: Family Medicine

## 2018-03-27 NOTE — Progress Notes (Signed)
Sent letter with results.

## 2018-06-26 ENCOUNTER — Ambulatory Visit: Payer: Self-pay | Admitting: Family Medicine

## 2018-10-08 ENCOUNTER — Other Ambulatory Visit: Payer: Self-pay | Admitting: Internal Medicine

## 2018-10-08 DIAGNOSIS — Z20822 Contact with and (suspected) exposure to covid-19: Secondary | ICD-10-CM

## 2018-10-10 LAB — NOVEL CORONAVIRUS, NAA: SARS-CoV-2, NAA: NOT DETECTED

## 2019-01-19 ENCOUNTER — Other Ambulatory Visit: Payer: Self-pay

## 2019-01-19 DIAGNOSIS — Z20822 Contact with and (suspected) exposure to covid-19: Secondary | ICD-10-CM

## 2019-01-20 LAB — NOVEL CORONAVIRUS, NAA: SARS-CoV-2, NAA: NOT DETECTED

## 2019-04-23 ENCOUNTER — Ambulatory Visit: Payer: Self-pay | Attending: Family

## 2019-04-23 DIAGNOSIS — Z23 Encounter for immunization: Secondary | ICD-10-CM | POA: Insufficient documentation

## 2019-04-23 NOTE — Progress Notes (Signed)
   Covid-19 Vaccination Clinic  Name:  Jenny Morales    MRN: [id number] DOB: 17-Nov-1970  04/23/2019  Jenny Morales was observed post Covid-19 immunization for 15 minutes without incidence. She was provided with Vaccine Information Sheet and instruction to access the V-Safe system.   Jenny Morales was instructed to call 911 with any severe reactions post vaccine: Marland Kitchen Difficulty breathing  . Swelling of your face and throat  . A fast heartbeat  . A bad rash all over your body  . Dizziness and weakness    Immunizations Administered    Name Date Dose VIS Date Route   Moderna COVID-19 Vaccine 04/23/2019 12:21 PM 0.5 mL 02/10/2019 Intramuscular   Manufacturer: Moderna   Lot: [id number]   [place]:[id number]

## 2019-05-26 ENCOUNTER — Ambulatory Visit: Payer: Self-pay | Attending: Internal Medicine

## 2019-05-26 DIAGNOSIS — Z23 Encounter for immunization: Secondary | ICD-10-CM

## 2019-05-26 NOTE — Progress Notes (Signed)
   Covid-19 Vaccination Clinic  Name:  [name]    MRN: [id number] DOB: 1970/10/25  05/26/2019  [name] was observed post Covid-19 immunization for 15 minutes without incident. She was provided with Vaccine Information Sheet and instruction to access the V-Safe system.   [name] was instructed to call 911 with any severe reactions post vaccine: Marland Kitchen Difficulty breathing  . Swelling of face and throat  . A fast heartbeat  . A bad rash all over body  . Dizziness and weakness   Immunizations Administered    Name Date Dose VIS Date Route   Moderna COVID-19 Vaccine 05/26/2019 11:50 AM 0.5 mL 02/10/2019 Intramuscular   Manufacturer: Moderna   Lot: [id number]   [place]:[id number]

## 2020-03-12 DIAGNOSIS — G4700 Insomnia, unspecified: Secondary | ICD-10-CM

## 2020-03-12 DIAGNOSIS — N939 Abnormal uterine and vaginal bleeding, unspecified: Secondary | ICD-10-CM

## 2020-03-12 HISTORY — DX: Insomnia, unspecified: G47.00

## 2020-03-12 HISTORY — DX: Abnormal uterine and vaginal bleeding, unspecified: N93.9

## 2020-11-25 ENCOUNTER — Ambulatory Visit (INDEPENDENT_AMBULATORY_CARE_PROVIDER_SITE_OTHER): Payer: 59 | Admitting: Family Medicine

## 2020-11-25 ENCOUNTER — Other Ambulatory Visit (HOSPITAL_COMMUNITY)
Admission: RE | Admit: 2020-11-25 | Discharge: 2020-11-25 | Disposition: A | Discharge status: Home / Self Care | Payer: 59 | Source: Ambulatory Visit | Attending: Family Medicine | Admitting: Family Medicine

## 2020-11-25 ENCOUNTER — Encounter: Payer: Self-pay | Admitting: Family Medicine

## 2020-11-25 ENCOUNTER — Other Ambulatory Visit: Payer: Self-pay

## 2020-11-25 VITALS — BP 164/104 | HR 86 | Wt 236.4 lb

## 2020-11-25 DIAGNOSIS — Z1231 Encounter for screening mammogram for malignant neoplasm of breast: Secondary | ICD-10-CM | POA: Diagnosis not present

## 2020-11-25 DIAGNOSIS — N939 Abnormal uterine and vaginal bleeding, unspecified: Secondary | ICD-10-CM | POA: Insufficient documentation

## 2020-11-25 DIAGNOSIS — Z1159 Encounter for screening for other viral diseases: Secondary | ICD-10-CM | POA: Diagnosis not present

## 2020-11-25 DIAGNOSIS — Z0000 Encounter for general adult medical examination without abnormal findings: Secondary | ICD-10-CM | POA: Insufficient documentation

## 2020-11-25 DIAGNOSIS — Z23 Encounter for immunization: Secondary | ICD-10-CM | POA: Diagnosis not present

## 2020-11-25 DIAGNOSIS — Z1211 Encounter for screening for malignant neoplasm of colon: Secondary | ICD-10-CM

## 2020-11-25 DIAGNOSIS — R030 Elevated blood-pressure reading, without diagnosis of hypertension: Secondary | ICD-10-CM

## 2020-11-25 DIAGNOSIS — F5101 Primary insomnia: Secondary | ICD-10-CM

## 2020-11-25 MED ORDER — HYDROXYZINE HCL 25 MG PO TABS: 25.0000 mg | ORAL_TABLET | Freq: Three times a day (TID) | ORAL | 0 refills | Status: DC | PRN

## 2020-11-25 MED ORDER — SHINGRIX 50 MCG/0.5ML IM SUSR: 0.5000 mL | INTRAMUSCULAR | 0 refills | Status: DC

## 2020-11-25 NOTE — Patient Instructions (Addendum)
It was wonderful to see you today.  Please bring ALL of your medications with you to every visit.   Today we talked about:  --Scheduling a 24 hour blood pressure cuff with Dr. Valentina Lucks  - Checking your blood count and hepatitis C antibody  - I will call you with blood work and Pap results   - For sleep---please let me know if you are interested in speaking with a therapist  - There are no great medications for insomnia---we often have to treat the anxiety. Medications like Prozac are often used---I would like you to follow up in 1-2 months to discuss this--please schedule a visit at the front   - A care navigator will call you about counseling  -- Someone will call you about a colonoscopy  - Please call to schedule your mammogram [phone]  - [place] will call with the time of your ultrasound  Thank you for choosing [place].   Please call [phone] with any questions about today's appointment.  Please be sure to schedule follow up at the front  desk before you leave today.   Dorris Singh, MD  Family Medicine

## 2020-11-25 NOTE — Progress Notes (Signed)
SUBJECTIVE:   CHIEF COMPLAINT: annual exam  HPI:   Jenny Morales is a 50 y.o. yo with history notable for elevated blood pressure and anxiety presenting for check-in.  The patient reports that for the past 2 years she has been very busy.  She lives with her wife.  She has had a number of stressors in her family.  Her grandfather passed away from pneumonia.  One of her nieces had a newly diagnosed brain cancer and her uncle died of complications of smoking.  Her mom passed away many years ago.  She works at [employer] and is in [employer].  She is no longer using cannabis.  The patient reports issues with insomnia.  She reports difficulty falling asleep and staying asleep.  She previously tried cognitive behavioral therapy for this but finds this is not working as well as it used to.  She is not regularly seeing a therapist.  Her wife is a clinical Education officer, museum and is recommended medications and possibly additional support.  The patient reports she thinks this stems from her anxiety.  She is worried about illness as well as multiple other things going on her life.  She reports sometimes she feels overwhelmed from her anxiety.  She is interested in counseling services to help with this as well as possibly medications.  The patient denies headaches, chest pain, dyspnea, lower extremity edema.  She does think she has some bulk symptoms possibly from fibroids.  She has a strong family history of these.  She reports her periods were regular up until about a year ago and now they have become more irregular.  Her periods are now every few months and slightly heavier and longer at times.  She is interested in definitive therapy for this and finding out the cause.  She is due for her Pap today.  PERTINENT  PMH / PSH/Family/Social History : Updated and reviewed as appropriate  OBJECTIVE:   BP (!) 164/104   Pulse 86   Wt 236 lb 6.4 oz (107.2 kg)   SpO2 100%   BMI 38.16 kg/m   Today's  weight:  Last Weight  Most recent update: 11/25/2020  9:50 AM    Weight  107.2 kg (236 lb 6.4 oz)            Review of prior weights: Filed Weights   11/25/20 0950  Weight: 236 lb 6.4 oz (107.2 kg)  HEENT: EOMI. Sclera without injection or icterus. MMM.  Neck: Supple.  Cardiac: Regular rate and rhythm. Normal S1/S2. No murmurs, rubs, or gallops appreciated. Lungs: Clear bilaterally to ascultation.  Abdomen: Normoactive bowel sounds. No tenderness to deep or light palpation. No rebound or guarding.   Neuro: Alert oriented to person place and situation Ext: No edema Psych: Pleasant and appropriate   GU Exam:  Chaperoned exam.  External exam: Normal-appearing female external genitalia.  Vaginal exam notable for normal apperance .  Cervix without discharge or obvious lesion.  Bimanual exam reveals an enlarged uterus. ASSESSMENT/PLAN:   Primary insomnia Discussed triggers as well as best practices in caring for this.  We discussed that this is often a manifestation of other conditions.  Discussed limitations of using medication therapy.  Prescribed Atarax to use as needed for the next month.  The patient is interested in counseling and we discussed this would be most effective.  Referral to chronic care management so that this is affordable through for her through her insurance.  Cost has been a  barrier in the past.  Elevated blood pressure reading Discussed options at length she is amenable to a 24-hour blood pressure cuff which was scheduled for her.  Abnormal uterine bleeding This is a new problem with irregularity to her menses, suspect this is perimenopausal symptoms, she does have some bulk symptoms likely related to what I suspect her fibroids.  Ultrasound has been ordered referral to OB/GYN to discuss options per patient preference.  We appreciate their recommendations regarding further management of her symptoms.    Annual exam We discussed healthy lifestyle in general,  avoiding cannabis products, improving overall mental health.  Mammogram ordered.  She is just turned 82 and is now due for colonoscopy.  We discussed the potential benefits and risks.  Colonoscopy order placed. Hep C screening, discussed, ordered   Follow up with PCP 1-2 months. Shringrix at follow up. Will call will results and BP results.    Dorris Singh, [place]

## 2020-11-25 NOTE — Assessment & Plan Note (Signed)
Discussed options at length she is amenable to a 24-hour blood pressure cuff which was scheduled for her.

## 2020-11-25 NOTE — Assessment & Plan Note (Signed)
Discussed triggers as well as best practices in caring for this.  We discussed that this is often a manifestation of other conditions.  Discussed limitations of using medication therapy.  Prescribed Atarax to use as needed for the next month.  The patient is interested in counseling and we discussed this would be most effective.  Referral to chronic care management so that this is affordable through for her through her insurance.  Cost has been a barrier in the past.

## 2020-11-25 NOTE — Assessment & Plan Note (Signed)
This is a new problem with irregularity to her menses, suspect this is perimenopausal symptoms, she does have some bulk symptoms likely related to what I suspect her fibroids.  Ultrasound has been ordered referral to OB/GYN to discuss options per patient preference.  We appreciate their recommendations regarding further management of her symptoms.

## 2020-11-26 LAB — CBC
Hematocrit: 43.1 % (ref 34.0–46.6)
Hemoglobin: 13.9 g/dL (ref 11.1–15.9)
MCH: 26.6 pg (ref 26.6–33.0)
MCHC: 32.3 g/dL (ref 31.5–35.7)
MCV: 82 fL (ref 79–97)
Platelets: 373 10*3/uL (ref 150–450)
RBC: 5.23 x10E6/uL (ref 3.77–5.28)
RDW: 14 % (ref 11.7–15.4)
WBC: 8.2 10*3/uL (ref 3.4–10.8)

## 2020-11-26 LAB — HCV AB W REFLEX TO QUANT PCR: HCV Ab: <0.1 s/co ratio (ref 0.0–0.9)

## 2020-11-26 LAB — HCV INTERPRETATION

## 2020-11-28 ENCOUNTER — Telehealth: Payer: Self-pay | Admitting: *Deleted

## 2020-11-28 ENCOUNTER — Encounter: Payer: Self-pay | Admitting: Gastroenterology

## 2020-11-28 DIAGNOSIS — F5101 Primary insomnia: Secondary | ICD-10-CM

## 2020-11-28 MED ORDER — HYDROXYZINE HCL 25 MG PO TABS: 25.0000 mg | ORAL_TABLET | Freq: Three times a day (TID) | ORAL | 0 refills | Status: DC | PRN

## 2020-11-28 NOTE — Telephone Encounter (Signed)
Pt calling in stating that the pharmacy stated they didn't receive rx for hydroxyzine. It does have a confirmation that they did get it. I told her I would ask you to resend it. Cordai Rodrigue Kennon Holter, CMA

## 2020-11-28 NOTE — Telephone Encounter (Signed)
Pt informed and said she spoke with the pharmacy and they received it. Jenny Morales Kennon Holter, CMA

## 2020-11-28 NOTE — Telephone Encounter (Signed)
Sent refill to pharmacy on file--please call patient and let her know. Please also confirm pharmacy. Thank you,  Dorris Singh, MD  [facility] Medicine Teaching Service

## 2020-11-29 ENCOUNTER — Telehealth: Payer: Self-pay | Admitting: Family Medicine

## 2020-11-29 LAB — CYTOLOGY - PAP
Adequacy: ABSENT
Comment: NEGATIVE
Diagnosis: NEGATIVE
High risk HPV: NEGATIVE

## 2020-11-29 NOTE — Telephone Encounter (Signed)
Called with Pap--repeat in 5 years--HPV negative and NILM, satisfactory but transformation zone absent. All questions answered.  Dorris Singh, MD  Family Medicine Teaching Service

## 2020-12-01 ENCOUNTER — Other Ambulatory Visit: Payer: Self-pay

## 2020-12-01 ENCOUNTER — Encounter: Payer: Self-pay | Admitting: Pharmacist

## 2020-12-01 ENCOUNTER — Ambulatory Visit (INDEPENDENT_AMBULATORY_CARE_PROVIDER_SITE_OTHER): Payer: 59 | Admitting: Pharmacist

## 2020-12-01 DIAGNOSIS — R030 Elevated blood-pressure reading, without diagnosis of hypertension: Secondary | ICD-10-CM | POA: Diagnosis not present

## 2020-12-01 NOTE — Progress Notes (Signed)
   S:    Patient arrives in good spirits. Presents to the clinic for ambulatory blood pressure evaluation.   Patient was referred and last seen by Primary Care Provider, Dr. Owens Shark, on 11/25/20.   Patient has a history of elevated BP reading but never been diagnosed with hypertension or taken medications for BP.   Discussed procedure for wearing the monitor and gave patient written instructions. Monitor was placed on non-dominant arm with instructions to return in the morning.   Current BP Medications include:  None  Antihypertensives tried in the past include: None  Dietary habits include: tries to eat healthy, cup and a half a day of coffee, no sodas  Exercise: work out for at least an hour 4-5 days per week  O:  Physical Exam Constitutional:      Appearance: Normal appearance.  Neurological:     Mental Status: She is alert.  Psychiatric:        Mood and Affect: Mood normal.        Behavior: Behavior normal.        Thought Content: Thought content normal.        Judgment: Judgment normal.    Review of Systems  All other systems reviewed and are negative.  Last 3 Office BP readings: BP Readings from Last 3 Encounters:  11/25/20 (!) 164/104  03/25/18 124/80  01/10/18 (!) 158/69    Clinical Atherosclerotic Cardiovascular Disease (ASCVD): No  The 10-year ASCVD risk score (Arnett DK, et al., 2019) is: 3.6%   Values used to calculate the score:     Age: 50 years     Sex: Female     Is Non-Hispanic African American: Yes     Diabetic: No     Tobacco smoker: No     Systolic Blood Pressure: 166 mmHg     Is BP treated: No     HDL Cholesterol: 57 mg/dL     Total Cholesterol: 142 mg/dL  Basic Metabolic Panel    Component Value Date/Time   NA 136 03/25/2018 1057   K 3.8 03/25/2018 1057   CL 102 03/25/2018 1057   CO2 21 03/25/2018 1057   GLUCOSE 129 (H) 03/25/2018 1057   BUN 9 03/25/2018 1057   CREATININE 0.74 03/25/2018 1057   CALCIUM 9.5 03/25/2018 1057   GFRNONAA  97 03/25/2018 1057   GFRAA 112 03/25/2018 1057    ABPM Study Data: Arm Placement left arm  Overall Mean 24hr BP:   153/92 mmHg  HR: 79  Daytime Mean BP:  162/100 mmHg HR: 82  Nighttime Mean BP:  134/76 mmHg  HR: 71  Dipping Pattern: Yes.    Sys:   17.7%   Dia: 24.2%   [normal dipping ~10-20%]  Non-hypertensive ABPM thresholds: daytime BP <125/75 mmHg, sleeptime BP <120/70 mmHg   A/P: Given 24-hour ambulatory blood pressure demonstrates stage 2 hypertension with an average overall blood pressure of 153/92 mmHg, Awake blood pressure of 162/100 and a nocturnal dipping pattern that is normal.  - Start amlodipine 5 mg once daily. - Patient educated on purpose, proper use and potential adverse effects of peripheral edema.  Following instruction patient verbalized understanding of treatment plan.  - Follow-up with Dr. Valentina Lucks on October 6th  Results reviewed and written information provided.  Total time in face-to-face counseling 20 minutes.   F/U Clinic Visit with Dr. Valentina Lucks in 2-3 weeks.  Patient seen with Meyer Russel, PharmD Candidate, Juluis Pitch PharmD - PGY-1 Pharmacy Resident

## 2020-12-01 NOTE — Patient Instructions (Addendum)
It was nice to see you today!  Start amlodipine 5 mg once daily.  Follow-up with Dr. Valentina Lucks on October 6th at 3:00pm.

## 2020-12-02 MED ORDER — AMLODIPINE BESYLATE 5 MG PO TABS: 5.0000 mg | ORAL_TABLET | Freq: Every day | ORAL | 3 refills | Status: DC

## 2020-12-02 NOTE — Assessment & Plan Note (Signed)
Given 24-hour ambulatory blood pressure demonstrates stage 2 hypertension with an average overall blood pressure of 153/92 mmHg, Awake blood pressure of 162/100 and a nocturnal dipping pattern that is normal.  - Start amlodipine 5 mg once daily. - Patient educated on purpose, proper use and potential adverse effects of peripheral edema.  Following instruction patient verbalized understanding of treatment plan.  - Follow-up with Dr. Valentina Lucks on October 6th

## 2020-12-05 NOTE — Progress Notes (Signed)
Reviewed: I agree with Dr. Koval's documentation and management. 

## 2020-12-06 ENCOUNTER — Other Ambulatory Visit: Payer: Self-pay | Admitting: Family Medicine

## 2020-12-06 DIAGNOSIS — N939 Abnormal uterine and vaginal bleeding, unspecified: Secondary | ICD-10-CM

## 2020-12-08 ENCOUNTER — Other Ambulatory Visit: Payer: Self-pay

## 2020-12-08 ENCOUNTER — Ambulatory Visit (HOSPITAL_COMMUNITY)
Admission: RE | Admit: 2020-12-08 | Discharge: 2020-12-08 | Disposition: A | Discharge status: Home / Self Care | Payer: 59 | Source: Ambulatory Visit | Attending: Family Medicine | Admitting: Family Medicine

## 2020-12-08 DIAGNOSIS — N939 Abnormal uterine and vaginal bleeding, unspecified: Secondary | ICD-10-CM | POA: Insufficient documentation

## 2020-12-09 ENCOUNTER — Other Ambulatory Visit: Payer: Self-pay | Admitting: Family Medicine

## 2020-12-09 ENCOUNTER — Telehealth: Payer: Self-pay | Admitting: Family Medicine

## 2020-12-09 DIAGNOSIS — N939 Abnormal uterine and vaginal bleeding, unspecified: Secondary | ICD-10-CM

## 2020-12-09 NOTE — Telephone Encounter (Signed)
Called with results.  Reviewed findings of cysts as well as fibroids.  Referral to gynecology was placed given bulk symptoms as well as abnormal uterine bleeding given her age of 74.Marland Kitchen  Discussed follow-up for patient including repeat ultrasound.  All questions were answered.  Patient would like to hold off with repeat ultrasound scheduling at this time.  We will set a reminder to self to make sure this is followed up after gynecology visit.  Reviewed reasons to call and return to care.

## 2020-12-15 ENCOUNTER — Other Ambulatory Visit: Payer: Self-pay

## 2020-12-15 ENCOUNTER — Ambulatory Visit (INDEPENDENT_AMBULATORY_CARE_PROVIDER_SITE_OTHER): Payer: 59 | Admitting: Pharmacist

## 2020-12-15 ENCOUNTER — Encounter: Payer: Self-pay | Admitting: Pharmacist

## 2020-12-15 VITALS — BP 138/78 | HR 71 | Wt 242.2 lb

## 2020-12-15 DIAGNOSIS — R030 Elevated blood-pressure reading, without diagnosis of hypertension: Secondary | ICD-10-CM | POA: Diagnosis not present

## 2020-12-15 MED ORDER — AMLODIPINE BESYLATE 10 MG PO TABS: 10.0000 mg | ORAL_TABLET | Freq: Every day | ORAL | 3 refills | Status: DC

## 2020-12-15 NOTE — Progress Notes (Signed)
S:    Patient arrives in good spirits. Presents to the clinic for hypertension evaluation, counseling, and management.  Patient was referred and last seen by Primary Care Provider, Dr. Owens Shark, on 11/25/20. Last seen by pharmacy clinic on 9/22 for 24h ambulatory BP monitor to determine if she has hypertension. Found average BP to be 153/92. Average BP during awake period 162/100 with normal nocturnal dipping pattern. Started amlodipine 5 mg at that time.   Today, patient reports she is doing well on amlodipine. Denies any lower extremity swelling, dizziness. Checks BP at home three times per week on MWF, reports readings 142/89-100. No changes in diet/exercise since last visit.   Medication adherence reported.   Current BP Medications include:  amlodipine 5 mg daily  Antihypertensives tried in the past include: none (newly diagnosed HTN)  Dietary habits include: tries to eat healthy, cup and a half a day of coffee, no sodas Exercise habits include:work out for at least an hour 4-5 days per week Family / Social history:  -Tobacco: former smoker (quit 2011)  O:  Physical Exam Vitals reviewed.  Constitutional:      Appearance: Normal appearance.  Cardiovascular:     Rate and Rhythm: Normal rate.  Pulmonary:     Effort: Pulmonary effort is normal.  Neurological:     Mental Status: She is alert.  Psychiatric:        Mood and Affect: Mood normal.        Behavior: Behavior normal.        Thought Content: Thought content normal.        Judgment: Judgment normal.   Review of Systems  All other systems reviewed and are negative.  Home BP readings: Checks BP at home three times per week on MWF, readings 142/89-100.   Last 3 Office BP readings: BP Readings from Last 3 Encounters:  12/15/20 138/78  11/25/20 (!) 164/104  03/25/18 124/80    BMET    Component Value Date/Time   NA 136 03/25/2018 1057   K 3.8 03/25/2018 1057   CL 102 03/25/2018 1057   CO2 21 03/25/2018 1057    GLUCOSE 129 (H) 03/25/2018 1057   BUN 9 03/25/2018 1057   CREATININE 0.74 03/25/2018 1057   CALCIUM 9.5 03/25/2018 1057   GFRNONAA 97 03/25/2018 1057   GFRAA 112 03/25/2018 1057    Renal function: CrCl cannot be calculated (Patient's most recent lab result is older than the maximum 21 days allowed.).  Clinical ASCVD: No  The 10-year ASCVD risk score (Arnett DK, et al., 2019) is: 1.8%   Values used to calculate the score:     Age: 50 years     Sex: Female     Is Non-Hispanic African American: Yes     Diabetic: No     Tobacco smoker: No     Systolic Blood Pressure: 476 mmHg     Is BP treated: No     HDL Cholesterol: 57 mg/dL     Total Cholesterol: 142 mg/dL   A/P: Hypertension recently diagnosed in 11/2020 currently uncontrolled on current medications but improved since starting amlodipine 5 mg at last visit. BP Goal = < 130/80 mmHg. Medication adherence appears optimal. Tolerating amlodipine well. Requires increased dose of amlodipine since not quite at goal.  -Increased amlodipine to 10 mg daily. Counseled that she can take two 5 mg tablets until she runs out. Counseled her to call if she experiences lower extremity edema.  -Encouraged her to continue checking BP  at home.  -Counseled on lifestyle modifications for blood pressure control including reduced dietary sodium, increased exercise, adequate sleep.  Results reviewed and written information provided.   Total time in face-to-face counseling 20 minutes.    F/U Clinic Visit as scheduled with PCP. Follow up with pharmacy clinic as needed. Patient seen with Elyse Jarvis, PharmD Candidate, and Rebbeca Paul, PharmD - PGY2 Pharmacy Resident.

## 2020-12-15 NOTE — Progress Notes (Signed)
Reviewed: I agree with Dr. Koval's documentation and management. 

## 2020-12-15 NOTE — Assessment & Plan Note (Signed)
Hypertension recently diagnosed in 11/2020 currently uncontrolled on current medications but improved since starting amlodipine 5 mg at last visit. BP Goal = < 130/80 mmHg. Medication adherence appears optimal. Tolerating amlodipine well. Requires increased dose of amlodipine since not quite at goal.  -Increased amlodipine to 10 mg daily. Counseled that she can take two 5 mg tablets until she runs out. Counseled her to call if she experiences lower extremity edema.  -Encouraged her to continue checking BP at home.  -Counseled on lifestyle modifications for blood pressure control including reduced dietary sodium, increased exercise, adequate sleep.

## 2020-12-15 NOTE — Patient Instructions (Signed)
It was nice to see you today!  Your goal blood pressure is less than 130/80 mmHg. In clinic, your blood pressure was 138/78 mmHg.  Medication Changes: Increase amlodipine to 10 mg daily. You can take two 5 mg tablets until you run out.   Monitor blood pressure at home daily and keep a log (on your phone or piece of paper) to bring with you to your next visit. Write down date, time, blood pressure and pulse.  Keep up the good work with diet and exercise. Aim for a diet full of vegetables, fruit and lean meats (chicken, [place], fish). Try to limit salt intake by eating fresh or frozen vegetables (instead of canned), rinse canned vegetables prior to cooking and do not add any additional salt to meals.

## 2021-01-11 ENCOUNTER — Other Ambulatory Visit: Payer: Self-pay

## 2021-01-11 ENCOUNTER — Encounter: Payer: Self-pay | Admitting: Obstetrics & Gynecology

## 2021-01-11 ENCOUNTER — Ambulatory Visit (INDEPENDENT_AMBULATORY_CARE_PROVIDER_SITE_OTHER): Payer: 59 | Admitting: Obstetrics & Gynecology

## 2021-01-11 VITALS — BP 153/84 | HR 72 | Ht 65.0 in | Wt 243.0 lb

## 2021-01-11 DIAGNOSIS — N939 Abnormal uterine and vaginal bleeding, unspecified: Secondary | ICD-10-CM | POA: Diagnosis not present

## 2021-01-11 DIAGNOSIS — D219 Benign neoplasm of connective and other soft tissue, unspecified: Secondary | ICD-10-CM | POA: Diagnosis not present

## 2021-01-11 DIAGNOSIS — I10 Essential (primary) hypertension: Secondary | ICD-10-CM

## 2021-01-11 HISTORY — DX: Essential (primary) hypertension: I10

## 2021-01-11 MED ORDER — NAPROXEN SODIUM 550 MG PO TABS: 550.0000 mg | ORAL_TABLET | Freq: Two times a day (BID) | ORAL | 2 refills | Status: DC

## 2021-01-11 NOTE — Progress Notes (Signed)
New Patient Office Visit  Subjective:  Patient ID: Jenny Morales, female    DOB: 05/03/1970  Age: 50 y.o. MRN: [id number]  CC:  Chief Complaint  Patient presents with   New Patient (Initial Visit)   Gynecologic Exam    HPI Jenny Morales presents for heavy and irregular menstrual bleeding. This has been occurring over the past three years and is getting worse. She also has experienced lower abdominal pressure and discomfort. She denies unusual period pain. She changes her pad every thirty minutes over the first 3 days of her period. Periods last for up to 7 days, but her cycles are 21-28 days long.  She is also experiencing some increased urinary frequency, but no dysuria or incontinence. She denies any constipation, dysuria, abnormal discharge, SOB, lightheadedness. An [place] on 9/29 showed a 17 cm enlarged uterus with 7 and 6 cm fibroids.  Past Medical History:  Diagnosis Date   Anxiety    Hypertension 01/11/2021   Obesity     Past Surgical History:  Procedure Laterality Date   TONSILLECTOMY AND ADENOIDECTOMY      Family History  Problem Relation Age of Onset   Multiple myeloma Mother 102       died at age 31   Drug abuse Father        died at age 27. also had cancer    Cancer Father        unknown kind    Brain cancer Other        neice    Social History   Socioeconomic History   Marital status: Married    Spouse name: Not on file   Number of children: Not on file   Years of education: Not on file   Highest education level: Not on file  Occupational History   Not on file  Tobacco Use   Smoking status: Former    Packs/day: 0.75    Years: 20.00    Pack years: 15.00    Types: Cigarettes    Start date: 03/12/1986    Quit date: 2011    Years since quitting: 11.8   Smokeless tobacco: Former  Scientific laboratory technician Use: Never used  Substance and Sexual Activity   Alcohol use: Yes    Comment: wine   Drug use: Not Currently    Types: Marijuana   Sexual  activity: Yes    Partners: Female    Birth control/protection: None  Other Topics Concern   Not on file  Social History Narrative   Works as Community education officer   Wife is partner   Her wife is a Holiday representative 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  Eyes:  Negative for visual disturbance.  Respiratory:  Negative for shortness of breath.   Gastrointestinal:  Negative for constipation, diarrhea and vomiting.  Genitourinary:  Positive for urgency and vaginal bleeding. Negative for decreased urine volume and vaginal discharge.   Objective:   Today's Vitals: BP (!) 153/84   Pulse 72   Ht $R'5\' 5"'Fi$  (1.651 m)   Wt 243 lb (110.2 kg)   LMP  01/09/2021   BMI 40.44 kg/m   Physical Exam HENT:     Head: Normocephalic and atraumatic.  Eyes:     Conjunctiva/sclera: Conjunctivae normal.  Pulmonary:     Effort: Pulmonary effort is normal.  Abdominal:     Comments: Large uterus (18-19 week size) and palpable left fibroid that protrudes past the umbilicus level on the left  Skin:    General: Skin is warm and dry.  Neurological:     Mental Status: She is alert.  Psychiatric:        Mood and Affect: Mood normal.        Behavior: Behavior normal.    Imaging: US Pelvic Complete With Transvaginal  Result Date: 12/08/2020 CLINICAL DATA:  Abnormal uterine bleeding EXAM: TRANSABDOMINAL AND TRANSVAGINAL ULTRASOUND OF PELVIS TECHNIQUE: Both transabdominal and transvaginal ultrasound examinations of the pelvis were performed. Transabdominal technique was performed for global imaging of the pelvis including uterus, ovaries, adnexal regions, and pelvic  cul-de-sac. It was necessary to proceed with endovaginal exam following the transabdominal exam to visualize the uterus and adnexa. COMPARISON:  None FINDINGS: Uterus Measurements: 17 x 9.1 x 13.4 cm = volume: 1084.2 mL. Several large uterine fibroids. Left uterine corpus mass measuring 7.7 x 6.7 x 6.7 cm. Posterior lower uterine segment mass measuring 5.4 x 6 x 4.4 cm. Endometrium Unable to measure, obscured by large fibroids Right ovary Measurements: 5.2 x 3.4 x 5 cm = volume: 45.4 mL. Numerous follicles Left ovary Measurements: 9 x 4.5 x 4.6 cm = volume: 96.8 mL. Numerous follicles. 4.7 x 3.3 x 2.5 cm complex cyst, possibly representing hemorrhagic cyst. Other findings Trace free fluid IMPRESSION: 1. Enlarged uterus with multiple fibroids. Endometrial stripe was obscured by large fibroids. 2. Bilateral ovarian enlargement with numerous follicles suggesting polycystic ovaries. 4.7 cm complex left adnexal cyst, indeterminate but probably benign and suggestive of hemorrhagic cyst, recommend 6-12 week sonographic follow-up. 3. Trace free fluid. Electronically Signed   By: Donavan Foil M.D.   On: 12/08/2020 15:12     Assessment & Plan:  Jenny Morales is a 50 y.o. female with a PMH of HTN and fibroids who presents for heavy menstrual bleeding.  Heavy Menstrual Bleeding 2/2 to Fibroids, enlarged uterus: With her heavy bleeding and large uterus/fibroids on ultrasound, her bleeding is likely secondary to these findings.  Reassured her of the benign nature of fibroids, but discussed need for endometrial biopsy to evaluate for endometrial pathology.  She had a normal pap smear with negative HRHPV on 11/25/2020.    Discussed management options for abnormal uterine bleeding including NSAIDs (Naproxen), tranexamic acid (Lysteda), hormonal management (oral, injection or IUD) or hysterectomy as definitive surgical management.  Discussed risks and benefits of each method; emphasized surgical risks of hysterectomy   bleeding which may require transfusion or reoperation; infection which may require prolonged hospitalization or re-hospitalization and antibiotic therapy; injury to bowel, bladder, ureters and major vessels or other surrounding organs; formation of adhesions; need for additional procedures including laparotomy or subsequent procedures secondary to abnormal pathology; thromboembolic phenomenon; incisional problems and other postoperative or anesthesia complications). All questions answered.   Patient desires Naproxen for now, will do research about Lysteda.  Printed patient education handouts were given to the patient to review at home.  Anaprox DS prescribed as needed for now,  bleeding precautions reviewed.  She will schedule a endometrial biopsy soon and will premedicate with naproxen/tylenol prior to the visit.  Problem List Items Addressed This Visit  Unprioritized   Abnormal uterine bleeding   Relevant Medications   naproxen sodium (ANAPROX DS) 550 MG tablet   Fibroids - Primary    Outpatient Encounter Medications as of 01/11/2021  Medication Sig   amLODipine (NORVASC) 10 MG tablet Take 1 tablet (10 mg total) by mouth daily.   Cholecalciferol (VITAMIN D3) 25 MCG (1000 UT) CAPS Take 1 capsule by mouth daily.   Ginkgo Biloba 40 MG TABS Take 1 tablet by mouth daily.   hydrOXYzine (ATARAX/VISTARIL) 25 MG tablet Take 1 tablet (25 mg total) by mouth 3 (three) times daily as needed. (Patient taking differently: Take 25 mg by mouth 3 (three) times daily as needed for anxiety.)   naproxen sodium (ANAPROX DS) 550 MG tablet Take 1 tablet (550 mg total) by mouth 2 (two) times daily with a meal. Take during periods or for pain   Omega-3 Fatty Acids (FISH OIL) 1000 MG CAPS Take by mouth.   Turmeric 500 MG CAPS Take by mouth.   [DISCONTINUED] chlorhexidine (HIBICLENS) 4 % external liquid Apply topically daily as needed. (Patient not taking: Reported on 12/15/2020)   [DISCONTINUED] multivitamin  (THERAGRAN) per tablet Take 1 tablet by mouth daily.   (Patient not taking: Reported on 01/11/2021)   No facility-administered encounter medications on file as of 01/11/2021.    Follow-up: Return in about 1 month (around 02/10/2021) for Endometrial biopsy, AUB, fibroid follow up.    Gertie Fey, Medical Student   Attestation of Attending Supervision of Student:  I confirm that I have verified the information documented in the medical student's note and that I have also personally performed the history, physical exam and all medical decision making activities.  I have verified that all services and findings are accurately documented in this student's note; and I agree with management and plan as outlined in the documentation. I have also made necessary editorial changes.   I spent 30 minutes dedicated to the care of this patient including pre-visit review of records, face to face time with the patient discussing her conditions and treatments.  Verita Schneiders, MD, [place] Attending [place], [facility] for [employer], [place]

## 2021-01-11 NOTE — Patient Instructions (Addendum)
Research about Lysteda, the non-hormonal way to treat menstrual bleeding  Take Naproxen as prescribed  Research hysterectomy  Take Naproxen prior to next visit/endometrial biopsy  Endometrial Biopsy An endometrial biopsy is a procedure to remove tissue samples from the endometrium, which is the lining of the uterus. The tissue that is removed can then be checked under a microscope for disease. This procedure is used to diagnose conditions such as endometrial cancer, endometrial tuberculosis, polyps, or other inflammatory conditions. This procedure may also be used to investigate uterine bleeding to determine where you are in your menstrual cycle or how your hormone levels are affecting the lining of the uterus. Tell a health care provider about: Any allergies you have. All medicines you are taking, including vitamins, herbs, eye drops, creams, and over-the-counter medicines. Any problems you or family members have had with anesthetic medicines. Any blood disorders you have. Any surgeries you have had. Any medical conditions you have. Whether you are pregnant or may be pregnant. What are the risks? Generally, this is a safe procedure. However, problems may occur, including: Bleeding. Pelvic infection. Puncture of the wall of the uterus with the biopsy device (rare). Allergic reactions to medicines. What happens before the procedure? Keep a record of your menstrual cycles as told by your health care provider. You may need to schedule your procedure for a specific time in your cycle. You may want to bring a sanitary pad to wear after the procedure. Plan to have someone take you home from the hospital or clinic. Ask your health care provider about: Changing or stopping your regular medicines. This is especially important if you are taking diabetes medicines, arthritis medicines, or blood thinners. Taking medicines such as aspirin and ibuprofen. These medicines can thin your blood. Do not  take these medicines unless your health care provider tells you to take them. Taking over-the-counter medicines, vitamins, herbs, and supplements. What happens during the procedure? You will lie on an exam table with your feet and legs supported as in a pelvic exam. Your health care provider will insert an instrument (speculum) into your vagina to see your cervix. Your cervix will be cleansed with an antiseptic solution. A medicine (local anesthetic) will be used to numb the cervix. A forceps instrument (tenaculum) will be used to hold your cervix steady for the biopsy. A thin, rod-like instrument (uterine sound) will be inserted through your cervix to determine the length of your uterus and the location where the biopsy sample will be removed. A thin, flexible tube (catheter) will be inserted through your cervix and into the uterus. The catheter will be used to collect the biopsy sample from your endometrial tissue. The catheter and speculum will then be removed, and the tissue sample will be sent to a lab for examination. The procedure may vary among health care providers and hospitals. What can I expect after procedure? You will rest in a recovery area until you are ready to go home. You may have mild cramping and a small amount of vaginal bleeding. This is normal. You may have a small amount of vaginal bleeding for a few days. This is normal. It is up to you to get the results of your procedure. Ask your health care provider, or the department that is doing the procedure, when your results will be ready. Follow these instructions at home: Take over-the-counter and prescription medicines only as told by your health care provider. Do not douche, use tampons, or have sexual intercourse until your health care  provider approves. Return to your normal activities as told by your health care provider. Ask your health care provider what activities are safe for you. Follow instructions from your  health care provider about any activity restrictions, such as restrictions on strenuous exercise or heavy lifting. Keep all follow-up visits. This is important. Contact a health care provider: You have heavy bleeding, or bleed for longer than 2 days after the procedure. You have bad smelling discharge from your vagina. You have a fever or chills. You have a burning sensation when urinating or you have difficulty urinating. You have severe pain in your lower abdomen. Get help right away if you: You have severe cramps in your stomach or back. You pass large blood clots. Your bleeding increases. You become weak or light-headed, or you faint or lose consciousness. Summary An endometrial biopsy is a procedure to remove tissue samples is taken from the endometrium, which is the lining of the uterus. The tissue sample that is removed will be checked under a microscope for disease. This procedure is used to diagnose conditions such as endometrial cancer, endometrial tuberculosis, polyps, or other inflammatory conditions. After the procedure, it is common to have mild cramping and a small amount of vaginal bleeding for a few days. Do not douche, use tampons, or have sexual intercourse until your health care provider approves. Ask your health care provider which activities are safe for you. This information is not intended to replace advice given to you by your health care provider. Make sure you discuss any questions you have with your health care provider. Document Revised: 09/21/2019 Document Reviewed: 09/21/2019 Elsevier Patient Education  2022 [place].  Abdominal Hysterectomy Abdominal hysterectomy is a surgical procedure to remove the uterus. The uterus is an organ that holds the baby as it develops before birth. This surgery may be done if a woman has certain problems of the uterus. These may include cancer or growths (tumors or fibroids). Other problems include infection, chronic pain,  severe bleeding, or other problems of the menstrual cycle. The procedure may also be done if: The uterus has slipped down into the vagina (uterine prolapse). The tissue that lines the uterus is growing outside of its normal location (endometriosis). Depending on the reason for this procedure, other reproductive organs may also be removed. These could include: The lowest part of the uterus (cervix), which opens into the vagina. The organs that make eggs (ovaries). The tubes that connect the ovaries to the uterus (fallopian tubes). Tell your health care provider about: Any allergies you have. All medicines you are taking, including vitamins, herbs, eye drops, creams, and over-the-counter medicines. Any problems you or family members have had with anesthetic medicines. Any blood disorders you have. Any surgeries you have had. Any medical conditions you have or have had. Whether you are pregnant or may be pregnant. What are the risks? Generally, this is a safe procedure. However, problems may occur, including: Bleeding. Infection. Allergic reactions to medicines or dyes. Damage to nearby structures or organs. Nerve injury. Decreased interest in sex or pain during sex. Blood clots that can break free and travel to your lungs. What happens before the procedure? Staying hydrated Follow instructions from your health care provider about hydration, which may include: Up to 2 hours before the procedure - you may continue to drink clear liquids, such as water, clear fruit juice, black coffee, and plain tea. Eating and drinking restrictions Follow instructions from your health care provider about eating and drinking, which may  include: 8 hours before the procedure - stop eating heavy meals or foods, such as meat, fried foods, or fatty foods. 6 hours before the procedure - stop eating light meals or foods, such as toast or cereal. 6 hours before the procedure - stop drinking milk or drinks that  contain milk. 2 hours before the procedure - stop drinking clear liquids. Medicines Ask your health care provider about: Changing or stopping your regular medicines. This is especially important if you are taking diabetes medicines or blood thinners. Taking medicines such as aspirin and ibuprofen. These medicines can thin your blood. Do not take these medicines unless your health care provider tells you to take them. Taking over-the-counter medicines, vitamins, herbs, and supplements. You may be asked to take a medicine to empty your colon (bowel preparation). General instructions This procedure can affect the way you feel about yourself. Talk with your health care provider about the physical and emotional changes this procedure may cause. Do not use any products that contain nicotine or tobacco for at least 4 weeks before the procedure. These products include cigarettes, chewing tobacco, and vaping devices, such as e-cigarettes. If you need help quitting, ask your health care provider. Do not drink beverages that contain alcohol prior to the procedure. Alcohol can increase your risk of bleeding and complications. If you need help stopping, ask your health care provider. Plan to have a responsible adult take you home from the hospital or clinic. Ask your health care provider: How your surgery site will be marked. What steps will be taken to help prevent infection. These steps may include: Removing hair at the surgery site. Washing skin with a germ-killing soap. Taking antibiotic medicine. What happens during the procedure? An IV will be inserted into one of your veins. You will be given one or more of the following: A medicine to help you relax (sedative). A medicine to make you fall asleep (general anesthetic). A medicine that is injected into your spine to numb the area below and slightly above the injection site (spinal anesthetic). A medicine that is injected into an area of your body to  numb everything below the injection site (regional anesthetic). Compression stockings will be placed on your legs to promote circulation. A thin, flexible tube (catheter) will be inserted to help drain your urine. An incision will be made through the skin in your lower abdomen. The incision may go side to side or up and down. Your uterus and any other organs that need to be removed will be carefully taken out. Bleeding will be controlled with clamps or stitches (sutures). Your incision will be closed with sutures, skin glue, or adhesive strips. A bandage (dressing) will be placed over the incision. The procedure may vary among health care providers and hospitals. What happens after the procedure? Your blood pressure, heart rate, breathing rate, and blood oxygen level will be monitored until you leave the hospital or clinic. You will be given pain medicine as needed. Ask your health care provider how long you will need to stay in the hospital after your procedure. You may have a liquid diet at first. You will most likely return to your usual diet the day after surgery. You will still have the urinary catheter in place. It will likely be removed the day after surgery. You may have to wear compression stockings. These stockings help to prevent blood clots and reduce swelling in your legs. You will be encouraged to walk as soon as possible. You will do  breathing exercises or use a device to help keep your lungs clear. You may need to use a sanitary napkin for discharge from the vagina. Summary Abdominal hysterectomy is a surgical procedure to remove the uterus. The uterus is the organ that holds a developing baby. This procedure can affect the way you feel about yourself. Talk with your health care provider about the physical and emotional changes this procedure may cause. You will be given pain medicine after the procedure. Ask your health care provider how long you will need to stay in the  hospital after your procedure. This information is not intended to replace advice given to you by your health care provider. Make sure you discuss any questions you have with your health care provider. Document Revised: 10/29/2019 Document Reviewed: 10/29/2019 Elsevier Patient Education  2022 [employer].

## 2021-01-11 NOTE — Progress Notes (Signed)
Pt is in office today to discuss AUB. Pt states heavy bleeding first few days of cycle with cramping but not severe.  Cycle normally last 3-5 days. Pt states cycles have gotten worse over the last few years.    Pt had recent u/s and pap with PCP. U/S noted fibroids.

## 2021-01-16 ENCOUNTER — Encounter: Payer: Self-pay | Admitting: Family Medicine

## 2021-01-16 ENCOUNTER — Other Ambulatory Visit: Payer: Self-pay

## 2021-01-16 ENCOUNTER — Ambulatory Visit (INDEPENDENT_AMBULATORY_CARE_PROVIDER_SITE_OTHER): Payer: 59 | Admitting: Family Medicine

## 2021-01-16 VITALS — BP 125/75 | HR 78 | Ht 66.0 in | Wt 242.8 lb

## 2021-01-16 DIAGNOSIS — D219 Benign neoplasm of connective and other soft tissue, unspecified: Secondary | ICD-10-CM | POA: Diagnosis not present

## 2021-01-16 DIAGNOSIS — I10 Essential (primary) hypertension: Secondary | ICD-10-CM | POA: Diagnosis not present

## 2021-01-16 NOTE — Assessment & Plan Note (Signed)
At goal.  Continue current therapy.

## 2021-01-16 NOTE — Progress Notes (Signed)
    SUBJECTIVE:   CHIEF COMPLAINT: BP check  HPI:   Jenny Morales is a 50 y.o. yo with history notable for hypertension and symptomatic fibroids presenting for BP check and insomnia   Patient reports adherence to amlodipine.  She denies chest pain, dizziness, dyspnea.  Patient reports she was seen by gynecology.  She is interested in potentially hysterectomy.  She would like to see physicians for [facility] for this.  She denies any bothersome symptoms from her fibroids at this time.  The patient reports her insomnia is improving.  She reports the last month has been better.  She is contemplating seeing a therapist in future.  PERTINENT  PMH / PSH/Family/Social History : Updated and reviewed as appropriate  OBJECTIVE:   BP 125/75   Pulse 78   Ht 5\' 6"  (1.676 m)   Wt 242 lb 12.8 oz (110.1 kg)   LMP 01/09/2021   SpO2 99%   BMI 39.19 kg/m   Today's weight:  Last Weight  Most recent update: 01/16/2021  9:15 AM    Weight  110.1 kg (242 lb 12.8 oz)            Review of prior weights: [employer]   01/16/21 0914  Weight: 242 lb 12.8 oz (110.1 kg)    Regular rate and rhythm no murmurs rubs or gallops.  Lungs clear bilaterally to auscultation.  No lower extremity edema. On her mid back there is a multi pigmented stuck on appearing lesion.  ASSESSMENT/PLAN:   Hypertension At goal.  Continue current therapy.   Irritated nevus versus seborrheic keratosis scheduled in dermatology clinic for removal.  Symptomatic fibroids and bulk symptoms desires definitive therapy, referred to gynecology.  HCM Declined COVID.   Due for BMP at follow up. Also obtain A1C.     Dorris Singh, [place]

## 2021-01-16 NOTE — Patient Instructions (Addendum)
It was wonderful to see you today.  Please bring ALL of your medications with you to every visit.   Today we talked about:  --I will check with our referrals coordinator about [facility] are scheduled for Dermatology clinic   Follow up in 6 months for blood pressure check    Thank you for choosing [facility].   Please call [phone] with any questions about today's appointment.  Please be sure to schedule follow up at the front  desk before you leave today.   Dorris Singh, MD  Family Medicine  Check out [email or web address] to see which therapists are covered by your insurance  It was great to see you

## 2021-01-23 ENCOUNTER — Ambulatory Visit
Admission: RE | Admit: 2021-01-23 | Discharge: 2021-01-23 | Disposition: A | Discharge status: Home / Self Care | Payer: 59 | Source: Ambulatory Visit | Attending: Family Medicine | Admitting: Family Medicine

## 2021-01-23 DIAGNOSIS — Z0000 Encounter for general adult medical examination without abnormal findings: Secondary | ICD-10-CM

## 2021-01-25 ENCOUNTER — Other Ambulatory Visit: Payer: Self-pay | Admitting: Family Medicine

## 2021-01-25 DIAGNOSIS — R928 Other abnormal and inconclusive findings on diagnostic imaging of breast: Secondary | ICD-10-CM

## 2021-01-30 ENCOUNTER — Other Ambulatory Visit: Payer: Self-pay

## 2021-01-30 ENCOUNTER — Ambulatory Visit (AMBULATORY_SURGERY_CENTER): Payer: 59

## 2021-01-30 VITALS — Ht 66.0 in | Wt 237.0 lb

## 2021-01-30 DIAGNOSIS — Z86010 Personal history of colonic polyps: Secondary | ICD-10-CM

## 2021-01-30 MED ORDER — PEG 3350-KCL-NA BICARB-NACL 420 G PO SOLR
4000.0000 mL | Freq: Once | ORAL | 0 refills | Status: AC
Start: 2021-01-30 — End: 2021-01-30

## 2021-01-30 NOTE — Progress Notes (Signed)

## 2021-02-08 ENCOUNTER — Encounter: Payer: Self-pay | Admitting: Family Medicine

## 2021-02-13 ENCOUNTER — Other Ambulatory Visit: Payer: Self-pay

## 2021-02-13 ENCOUNTER — Ambulatory Visit (AMBULATORY_SURGERY_CENTER): Payer: 59 | Admitting: Gastroenterology

## 2021-02-13 ENCOUNTER — Encounter: Payer: Self-pay | Admitting: Gastroenterology

## 2021-02-13 VITALS — BP 131/78 | HR 70 | Temp 97.3°F | Resp 13 | Ht 66.0 in | Wt 237.0 lb

## 2021-02-13 DIAGNOSIS — D125 Benign neoplasm of sigmoid colon: Secondary | ICD-10-CM | POA: Diagnosis not present

## 2021-02-13 DIAGNOSIS — Z86010 Personal history of colonic polyps: Secondary | ICD-10-CM | POA: Diagnosis not present

## 2021-02-13 DIAGNOSIS — D123 Benign neoplasm of transverse colon: Secondary | ICD-10-CM | POA: Diagnosis not present

## 2021-02-13 HISTORY — PX: COLONOSCOPY: SHX174

## 2021-02-13 MED ORDER — SODIUM CHLORIDE 0.9 % IV SOLN: 500.0000 mL | Freq: Once | INTRAVENOUS | Status: DC

## 2021-02-13 NOTE — Progress Notes (Signed)
VS completed by CW.   Pt's states no medical or surgical changes since previsit or office visit.  

## 2021-02-13 NOTE — Progress Notes (Signed)
See 01/16/2021 H&P, no changes.

## 2021-02-13 NOTE — Op Note (Signed)
[facility] Patient Name: Edessa Jakubowicz Procedure Date: 02/13/2021 8:36 AM MRN: [id number] Endoscopist: Ladene Artist , MD Age: 50 Referring MD:  Date of Birth: March 26, 1970 Gender: Female Account #: [id number] Procedure:                Colonoscopy Indications:              Surveillance: Personal history of adenomatous                            polyps on last colonoscopy > 5 years ago Medicines:                Monitored Anesthesia Care Procedure:                Pre-Anesthesia Assessment:                           - Prior to the procedure, a History and Physical                            was performed, and patient medications and                            allergies were reviewed. The patient's tolerance of                            previous anesthesia was also reviewed. The risks                            and benefits of the procedure and the sedation                            options and risks were discussed with the patient.                            All questions were answered, and informed consent                            was obtained. Prior Anticoagulants: The patient has                            taken no previous anticoagulant or antiplatelet                            agents. ASA Grade Assessment: II - A patient with                            mild systemic disease. After reviewing the risks                            and benefits, the patient was deemed in                            satisfactory condition to undergo the procedure.  After obtaining informed consent, the colonoscope                            was passed under direct vision. Throughout the                            procedure, the patient's blood pressure, pulse, and                            oxygen saturations were monitored continuously. The                            Olympus #[id number] was introduced through the anus                            and advanced to the  the cecum, identified by                            appendiceal orifice and ileocecal valve. The                            ileocecal valve, appendiceal orifice, and rectum                            were photographed. The quality of the bowel                            preparation was good. The colonoscopy was performed                            without difficulty. The patient tolerated the                            procedure well. Scope In: 8:41:38 AM Scope Out: 9:00:48 AM Scope Withdrawal Time: 0 hours 15 minutes 54 seconds  Total Procedure Duration: 0 hours 19 minutes 10 seconds  Findings:                 The perianal and digital rectal examinations were                            normal.                           A 18 mm polyp was found in the mid transverse                            colon. The polyp was pedunculated. The polyp was                            removed with a hot snare. Resection and retrieval                            were complete.  Two sessile polyps were found in the sigmoid colon                            and distal transverse colon. The polyps were 6 to 7                            mm in size. These polyps were removed with a cold                            snare. Resection and retrieval were complete.                           Internal hemorrhoids were found during                            retroflexion. The hemorrhoids were small and Grade                            I (internal hemorrhoids that do not prolapse).                           The exam was otherwise without abnormality on                            direct and retroflexion views. Complications:            No immediate complications. Estimated blood loss:                            None. Estimated Blood Loss:     Estimated blood loss: none. Impression:               - One 18 mm polyp in the mid transverse colon,                            removed with a hot snare.  Resected and retrieved.                           - Two 6 to 7 mm polyps in the sigmoid colon and in                            the distal transverse colon, removed with a cold                            snare. Resected and retrieved.                           - Internal hemorrhoids.                           - The examination was otherwise normal on direct                            and retroflexion views. Recommendation:           -  Repeat colonoscopy after studies are complete for                            surveillance based on pathology results.                           - Patient has a contact number available for                            emergencies. The signs and symptoms of potential                            delayed complications were discussed with the                            patient. Return to normal activities tomorrow.                            Written discharge instructions were provided to the                            patient.                           - Resume previous diet.                           - Continue present medications.                           - Await pathology results.                           - No aspirin, ibuprofen, naproxen, or other                            non-steroidal anti-inflammatory drugs for 2 weeks                            after polyp removal. Ladene Artist, MD 02/13/2021 9:05:21 AM This report has been signed electronically.

## 2021-02-13 NOTE — Patient Instructions (Signed)
Impression/Recommendations:  Polyp and hemorrhoid handouts given to patient.  Resume previous diet. Continue present medications. Await pathology results.  No aspirin, Ibuprofen, naproxen, or other NSAID drugs for 2 weeks.  YOU HAD AN ENDOSCOPIC PROCEDURE TODAY AT [facility]:   Refer to the procedure report that was given to you for any specific questions about what was found during the examination.  If the procedure report does not answer your questions, please call your gastroenterologist to clarify.  If you requested that your care partner not be given the details of your procedure findings, then the procedure report has been included in a sealed envelope for you to review at your convenience later.  YOU SHOULD EXPECT: Some feelings of bloating in the abdomen. Passage of more gas than usual.  Walking can help get rid of the air that was put into your GI tract during the procedure and reduce the bloating. If you had a lower endoscopy (such as a colonoscopy or flexible sigmoidoscopy) you may notice spotting of blood in your stool or on the toilet paper. If you underwent a bowel prep for your procedure, you may not have a normal bowel movement for a few days.  Please Note:  You might notice some irritation and congestion in your nose or some drainage.  This is from the oxygen used during your procedure.  There is no need for concern and it should clear up in a day or so.  SYMPTOMS TO REPORT IMMEDIATELY:  Following lower endoscopy (colonoscopy or flexible sigmoidoscopy):  Excessive amounts of blood in the stool  Significant tenderness or worsening of abdominal pains  Swelling of the abdomen that is new, acute  Fever of 100F or higher For urgent or emergent issues, a gastroenterologist can be reached at any hour by calling [phone]. Do not use MyChart messaging for urgent concerns.    DIET:  We do recommend a small meal at first, but then you may proceed to your  regular diet.  Drink plenty of fluids but you should avoid alcoholic beverages for 24 hours.  ACTIVITY:  You should plan to take it easy for the rest of today and you should NOT DRIVE or use heavy machinery until tomorrow (because of the sedation medicines used during the test).    FOLLOW UP: Our staff will call the number listed on your records 48-72 hours following your procedure to check on you and address any questions or concerns that you may have regarding the information given to you following your procedure. If we do not reach you, we will leave a message.  We will attempt to reach you two times.  During this call, we will ask if you have developed any symptoms of COVID 19. If you develop any symptoms (ie: fever, flu-like symptoms, shortness of breath, cough etc.) before then, please call [phone].  If you test positive for Covid 19 in the 2 weeks post procedure, please call and report this information to [place].    If any biopsies were taken you will be contacted by phone or by letter within the next 1-3 weeks.  Please call us at [phone] if you have not heard about the biopsies in 3 weeks.    SIGNATURES/CONFIDENTIALITY: You and/or your care partner have signed paperwork which will be entered into your electronic medical record.  These signatures attest to the fact that that the information above on your After Visit Summary has been reviewed and is understood.  Full responsibility of the  confidentiality of this discharge information lies with you and/or your care-partner.  

## 2021-02-13 NOTE — Progress Notes (Signed)
Called to room to assist during endoscopic procedure.  Patient ID and intended procedure confirmed with present staff. Received instructions for my participation in the procedure from the performing physician.  

## 2021-02-15 ENCOUNTER — Telehealth: Payer: Self-pay

## 2021-02-15 ENCOUNTER — Ambulatory Visit
Admission: RE | Admit: 2021-02-15 | Discharge: 2021-02-15 | Disposition: A | Discharge status: Home / Self Care | Payer: 59 | Source: Ambulatory Visit | Attending: Family Medicine | Admitting: Family Medicine

## 2021-02-15 DIAGNOSIS — R928 Other abnormal and inconclusive findings on diagnostic imaging of breast: Secondary | ICD-10-CM

## 2021-02-15 NOTE — Telephone Encounter (Signed)
  Follow up Call-  Call back number 02/13/2021  Post procedure Call Back phone  # [phone]  Permission to leave phone message Yes  Some recent data might be hidden     Patient questions:  Do you have a fever, pain , or abdominal swelling? No. Pain Score  0 *  Have you tolerated food without any problems? Yes.    Have you been able to return to your normal activities? Yes.    Do you have any questions about your discharge instructions: Diet   No. Medications  No. Follow up visit  No.  Do you have questions or concerns about your Care? No.  Actions: * If pain score is 4 or above: No action needed, pain <4.  Have you developed a fever since your procedure? no  2.   Have you had an respiratory symptoms (SOB or cough) since your procedure? no  3.   Have you tested positive for COVID 19 since your procedure no  4.   Have you had any family members/close contacts diagnosed with the COVID 19 since your procedure?  no   If yes to any of these questions please route to Joylene John, RN and Joella Prince, RN

## 2021-02-16 ENCOUNTER — Ambulatory Visit: Payer: 59

## 2021-02-22 ENCOUNTER — Other Ambulatory Visit: Payer: 59 | Admitting: Obstetrics and Gynecology

## 2021-02-27 ENCOUNTER — Other Ambulatory Visit: Payer: 59

## 2021-03-02 ENCOUNTER — Ambulatory Visit: Payer: 59

## 2021-03-06 ENCOUNTER — Encounter: Payer: Self-pay | Admitting: Gastroenterology

## 2021-03-14 DIAGNOSIS — N939 Abnormal uterine and vaginal bleeding, unspecified: Secondary | ICD-10-CM | POA: Diagnosis not present

## 2021-03-14 DIAGNOSIS — D259 Leiomyoma of uterus, unspecified: Secondary | ICD-10-CM | POA: Diagnosis not present

## 2021-03-14 DIAGNOSIS — N83209 Unspecified ovarian cyst, unspecified side: Secondary | ICD-10-CM | POA: Diagnosis not present

## 2021-03-16 ENCOUNTER — Ambulatory Visit (INDEPENDENT_AMBULATORY_CARE_PROVIDER_SITE_OTHER): Payer: BC Managed Care – PPO | Admitting: Family Medicine

## 2021-03-16 ENCOUNTER — Other Ambulatory Visit: Payer: Self-pay

## 2021-03-16 VITALS — BP 131/80 | HR 91 | Ht 66.0 in | Wt 249.0 lb

## 2021-03-16 DIAGNOSIS — L821 Other seborrheic keratosis: Secondary | ICD-10-CM | POA: Diagnosis not present

## 2021-03-16 DIAGNOSIS — L710 Perioral dermatitis: Secondary | ICD-10-CM | POA: Insufficient documentation

## 2021-03-16 MED ORDER — METRONIDAZOLE 1 % EX GEL
CUTANEOUS | 1 refills | Status: DC
Start: 2021-03-16 — End: 2022-10-19

## 2021-03-16 NOTE — Patient Instructions (Addendum)
It was great to see you!  The spot on your back is called a "seborrheic keratosis". This is benign (normal) and nothing to worry about.  For your facial rash, I have sent a prescription called Metrogel to your pharmacy. You can apply this once daily as needed for flares. You can also try over the counter Benzoyl Peroxide cream at night for flares. Start with the lowest strength (2%).  Take care and seek immediate care sooner if you develop any concerns.  Dr. [facility]

## 2021-03-16 NOTE — Progress Notes (Signed)
° ° °  SUBJECTIVE:   CHIEF COMPLAINT / HPI:   Bumps on Face -Red bumps located around her mouth (chin and lower cheeks) -Sometimes white in the center -Occurring intermittently over past 5 years -Flares once every few months -Not painful -Occasionally itchy -Saw dermatology in the past, who prescribed a cream with peroxide in it. This was helpful. She would apply it at night -Otherwise has not tried any medications -Washes her face regularly with Cetaphil cleanser  Spot on Back -Present for a few years -Noticed it because it was dry and itchy -Dark in color -Previously told it was an age spot -Does not bother her, she just wants to make sure it's benign -No personal or family hx of skin cancer -No excess sun exposure, no tanning bed use   PERTINENT  PMH / PSH: HTN, anxiety  OBJECTIVE:   BP 131/80    Pulse 91    Ht 5\' 6"  (1.676 m)    Wt 249 lb (112.9 kg)    SpO2 99%    BMI 40.19 kg/m   General: NAD, pleasant, able to participate in exam Respiratory: No respiratory distress Skin: scattered pinpoint erythematous papules located in perioral region, no vesicles. ~48mm hyperpigmented, dry, stuck-on papule on left lateral lower thoracic back  Neuro: grossly intact   ASSESSMENT/PLAN:   Perioral dermatitis Presentation consistent with perioral dermatitis. Overall mild in severity (flares every few months, not particularly bothersome). Did well with what seems to be benzoyl peroxide in the past. -Rx sent for Metrogel 1% daily as needed for flares -Patient to also trial OTC benzoyl-peroxide 2% if Metrogel is unhelpful   Seborrheic Keratosis 4-30mm, located on left thoracic back, asymptomatic. -Counseled on benign nature -Reassurance provided  Alcus Dad, MD [facility]

## 2021-03-16 NOTE — Assessment & Plan Note (Signed)
Presentation consistent with perioral dermatitis. Overall mild in severity (flares every few months, not particularly bothersome). Did well with what seems to be benzoyl peroxide in the past. -Rx sent for Metrogel 1% daily as needed for flares -Patient to also trial OTC benzoyl-peroxide 2% if Metrogel is unhelpful

## 2021-03-27 ENCOUNTER — Encounter: Payer: Self-pay | Admitting: Family Medicine

## 2021-05-30 ENCOUNTER — Encounter (HOSPITAL_BASED_OUTPATIENT_CLINIC_OR_DEPARTMENT_OTHER): Payer: Self-pay | Admitting: Obstetrics & Gynecology

## 2021-05-30 ENCOUNTER — Other Ambulatory Visit: Payer: Self-pay

## 2021-05-30 DIAGNOSIS — M1990 Unspecified osteoarthritis, unspecified site: Secondary | ICD-10-CM

## 2021-05-30 HISTORY — DX: Unspecified osteoarthritis, unspecified site: M19.90

## 2021-05-30 NOTE — Progress Notes (Signed)
Spoke w/ via phone for pre-op interview---Jenny Morales ?Lab needs dos---- urine pregnancy POCT per anesthesia, surgeon orders pending as of 05/30/21              ?Lab results------06/08/21 lab appt for CBC, type & screen, BMP, EKG ?COVID test -----patient states asymptomatic no test needed ?Arrive at -------0530 on Monday, 06/12/21 ?NPO after MN NO Solid Food.  Clear liquids from MN until---0430 ?Med rec completed ?Medications to take morning of surgery -----Amlodipine, Zyrtec prn ?Diabetic medication -----n/a ?Patient instructed no nail polish to be worn day of surgery ?Patient instructed to bring photo id and insurance card day of surgery ?Patient aware to have Driver (ride ) / caregiver    for 24 hours after surgery - wife [name] ?Patient Special Instructions -----No marijuana or alcohol 24 hours before surgery. Extended recovery instructions given. ?Pre-Op special Istructions -----Requested orders from [name] via Epic IB on 05/30/21. ?Patient verbalized understanding of instructions that were given at this phone interview. ?Patient denies shortness of breath, chest pain, fever, cough at this phone interview.  ?

## 2021-05-30 NOTE — Progress Notes (Signed)
?PLEASE WEAR A MASK OUT IN PUBLIC AND SOCIAL DISTANCE AND [place] YOUR HANDS FREQUENTLY. PLEASE ASK ALL YOUR CLOSE HOUSEHOLD CONTACT TO WEAR MASK OUT IN PUBLIC AND SOCIAL DISTANCE AND [place] HANDS FREQUENTLY ALSO. ? ? ? ? ? Your procedure is scheduled on Monday, 06/12/21. ? Report to [facility] AT  5:30 AM. ? ? Call this number if you have problems the morning of surgery  [phone]. ? ? [facility].  WE ARE LOCATED IN [facility]. ? ?PLEASE BRING YOUR INSURANCE CARD AND PHOTO ID DAY OF SURGERY. ? ?ONLY TWO PEOPLE ALLOWED IN FACILITY WAITING AREA. ?                                   ? REMEMBER: ? DO NOT EAT FOOD, CANDY GUM OR MINTS  AFTER MIDNIGHT THE NIGHT BEFORE YOUR SURGERY . YOU MAY HAVE CLEAR LIQUIDS FROM MIDNIGHT THE NIGHT BEFORE YOUR SURGERY UNTIL  4:30 am NO CLEAR LIQUIDS AFTER   4:30 am DAY OF SURGERY. ? ? YOU MAY  BRUSH YOUR TEETH MORNING OF SURGERY AND RINSE YOUR MOUTH OUT, NO CHEWING GUM CANDY OR MINTS. ? ? ? ?CLEAR LIQUID DIET ? ? ?Foods Allowed                                                                     Foods Excluded ? ?Coffee and tea, regular and decaf                             liquids that you cannot  ?Plain Jell-O any favor except red or purple                                           see through such as: ?Fruit ices (not with fruit pulp)                                     milk, soups, orange juice  ?Iced Popsicles                                    All solid food ?Carbonated beverages, regular and diet                                    ?Cranberry, grape and apple juices ?Sports drinks like Gatorade ? ?Sample Menu ?Breakfast                                Lunch  Supper ?Cranberry juice                                           ?Jell-O                                     Grape juice                           Apple juice ?Coffee or tea                        Jell-O                                       Popsicle ?                                               Coffee or tea                        Coffee or tea ? ?_____________________________________________________________________ ?  ? ?TAKE THESE MEDICATIONS MORNING OF SURGERY WITH A SIP OF WATER:  Amlodipine, Zyrtec if needed ? ?TWO VISITORS ARE ALLOWED IN WAITING ROOM ONLY DAY OF SURGERY.  YOU MAY HAVE ANOTHER PERSON SWITCH OUT WITH THE  1  VISITOR IN THE WAITING ROOM DAY OF SURGERY AND A MASK MUST BE WORN IN THE WAITING ROOM.  ? ?FOUR VISITORS  MAY VISIT IN THE EXTENDED RECOVERY ROOM UNTIL 800 PM ONLY 1 VISITOR AGE 35 AND OVER MAY SPEND THE NIGHT AND MUST BE IN EXTENDED RECOVERY ROOM NO LATER THAN 800 PM .  ? ? UP TO 2 CHILDREN AGE 41 TO 15 MAY ALSO VISIT IN EXTENDED RECOV ?ERY ROOM ONLY UNTIL 800 PM AND MUST LEAVE BY 800 PM.  ? ?ALL PERSONS VISITING IN EXTENDED RECOVERY ROOM MUST WEAR A MASK. ?                                   ?DO NOT WEAR JEWERLY, MAKE UP. ?DO NOT WEAR LOTIONS, POWDERS, PERFUMES OR NAIL POLISH ON YOUR FINGERNAILS. TOENAIL POLISH IS OK TO WEAR. ?DO NOT SHAVE FOR 48 HOURS PRIOR TO DAY OF SURGERY. ?MEN MAY SHAVE FACE AND NECK. ?CONTACTS, GLASSES, OR DENTURES MAY NOT BE WORN TO SURGERY. ?                                   ?[facility] IS NOT RESPONSIBLE  FOR ANY BELONGINGS.                                  ?                                  . ?  [facility] - Preparing for Surgery ?Before surgery, you can play an important role.  Because skin is not sterile, your skin needs to be as free of germs as possible.  You can reduce the number of germs on your skin by washing with CHG (chlorahexidine gluconate) soap before surgery.  CHG is an antiseptic cleaner which kills germs and bonds with the skin to continue killing germs even after washing. ?Please DO NOT use if you have an allergy to CHG or antibacterial soaps.  If your skin becomes reddened/irritated stop using the CHG and inform your nurse when you arrive at Short Stay. ?Do not  shave (including legs and underarms) for at least 48 hours prior to the first CHG shower.  You may shave your face/neck. ?Please follow these instructions carefully: ? 1.  Shower with CHG Soap the night before surgery and the  morning of Surgery. ? 2.  If you choose to wash your hair, wash your hair first as usual with your  normal  shampoo. ? 3.  After you shampoo, rinse your hair and body thoroughly to remove the  shampoo.                            ?4.  Use CHG as you would any other liquid soap.  You can apply chg directly  to the skin and wash  ?                    Gently with a scrungie or clean washcloth. ? 5.  Apply the CHG Soap to your body ONLY FROM THE NECK DOWN.   Do not use on face/ open      ?                     Wound or open sores. Avoid contact with eyes, ears mouth and genitals (private parts).  ?                     Production manager,  Genitals (private parts) with your normal soap. ?            6.  Wash thoroughly, paying special attention to the area where your surgery  will be performed. ? 7.  Thoroughly rinse your body with warm water from the neck down. ? 8.  DO NOT shower/wash with your normal soap after using and rinsing off  the CHG Soap. ?               9.  Pat yourself dry with a clean towel. ?           10.  Wear clean pajamas. ?           11.  Place clean sheets on your bed the night of your first shower and do not  sleep with pets. ?Day of Surgery : ?Do not apply any lotions/deodorants the morning of surgery.  Please wear clean clothes to the hospital/surgery center. ? ?IF YOU HAVE ANY SKIN IRRITATION OR PROBLEMS WITH THE SURGICAL SOAP, PLEASE GET A BAR OF GOLD DIAL SOAP AND SHOWER THE NIGHT BEFORE YOUR SURGERY AND THE MORNING OF YOUR SURGERY. PLEASE LET THE NURSE KNOW MORNING OF YOUR SURGERY IF YOU HAD ANY PROBLEMS WITH THE SURGICAL SOAP. ? ? ?________________________________________________________________________                  ?                                    ?  QUESTIONS CALL Barbara Ahart  PRE OP NURSE PHONE [phone].                                    ?

## 2021-06-05 DIAGNOSIS — D259 Leiomyoma of uterus, unspecified: Secondary | ICD-10-CM | POA: Diagnosis not present

## 2021-06-08 ENCOUNTER — Encounter (HOSPITAL_COMMUNITY)
Admission: RE | Admit: 2021-06-08 | Discharge: 2021-06-08 | Disposition: A | Discharge status: Home / Self Care | Payer: BC Managed Care – PPO | Source: Ambulatory Visit | Attending: Obstetrics & Gynecology | Admitting: Obstetrics & Gynecology

## 2021-06-08 DIAGNOSIS — D219 Benign neoplasm of connective and other soft tissue, unspecified: Secondary | ICD-10-CM | POA: Insufficient documentation

## 2021-06-08 DIAGNOSIS — Z01818 Encounter for other preprocedural examination: Secondary | ICD-10-CM | POA: Diagnosis not present

## 2021-06-08 LAB — CBC
HCT: 42.2 % (ref 36.0–46.0)
Hemoglobin: 13.6 g/dL (ref 12.0–15.0)
MCH: 26.8 pg (ref 26.0–34.0)
MCHC: 32.2 g/dL (ref 30.0–36.0)
MCV: 83.2 fL (ref 80.0–100.0)
Platelets: 329 10*3/uL (ref 150–400)
RBC: 5.07 MIL/uL (ref 3.87–5.11)
RDW: 15 % (ref 11.5–15.5)
WBC: 7.4 10*3/uL (ref 4.0–10.5)
nRBC: 0 % (ref 0.0–0.2)

## 2021-06-08 LAB — COMPREHENSIVE METABOLIC PANEL
ALT: 16 U/L (ref 0–44)
AST: 16 U/L (ref 15–41)
Albumin: 3.9 g/dL (ref 3.5–5.0)
Alkaline Phosphatase: 43 U/L (ref 38–126)
Anion gap: 6 (ref 5–15)
BUN: 12 mg/dL (ref 6–20)
CO2: 25 mmol/L (ref 22–32)
Calcium: 9.1 mg/dL (ref 8.9–10.3)
Chloride: 104 mmol/L (ref 98–111)
Creatinine, Ser: 0.72 mg/dL (ref 0.44–1.00)
GFR, Estimated: >60 mL/min (ref >60)
Glucose, Bld: 111 mg/dL — ABNORMAL HIGH (ref 70–99)
Potassium: 4.1 mmol/L (ref 3.5–5.1)
Sodium: 135 mmol/L (ref 135–145)
Total Bilirubin: 0.5 mg/dL (ref 0.3–1.2)
Total Protein: 7.7 g/dL (ref 6.5–8.1)

## 2021-06-09 NOTE — H&P (Signed)
Jenny Morales is an 51 y.o. female with symptomatic uterine fibroids.  Patient complains bulk symptoms with discomfort laying on abdomen and when bending over. Patient also c/o decreased bladder capacity. Patient has negative surgical hx. U/S on 03/14/21 showed enlarged, fibroid uterus with volume 1244 cc. Multiple fibroids are measured: 8.2 x 7.3 cm, 5.8 x 4.8 cm, 4.9 x 4.6 cm, 2.9 x 2.3 cm. Right ovary with 4.5 x 3.2 cm cyst containing diffuse echoes. Left ovary with multi-cystic mass some diffuse echoes: 2.9 cm, 4.6 cm and 3.9 cm. Small amount of FF.  CA-125 28.7.  Preop Hgb on 3/30 13.6.  No VMS. Declines conservative measures such as myfembree and UFE and desires definitive treatment.    ? ?Pertinent Gynecological History: ?Menses: flow is excessive with use of 8 pads or tampons on heaviest days ?Bleeding: regular ?Contraception:  same sex relationship ?DES exposure: unknown ?Blood transfusions: none ?Sexually transmitted diseases: no past history ?Previous GYN Procedures:  n/a   ?Last mammogram: normal Date: 02/15/21 ?Last pap: normal Date: 11/25/20 ?OB History: G0 ? ?Menstrual History: ?Menarche age: n/a ?Patient's last menstrual period was 05/12/2021. ?  ? ?Past Medical History:  ?Diagnosis Date  ? Abnormal uterine bleeding (AUB) 2022  ? heavy menstrual bleeding  ? Anxiety   ? generalized anxiety disorder with a history of panic attatcks  ? Arthritis 05/30/2021  ? knees  ? Fibroids   ? Heart murmur   ? Per pt, heart mumrmur was from having rheumatic fever as a child. Pt was on PCN for years. In 60 's, pt stated that she no longer had a heart murmur and was taken off the PCN.  ? History of palpitations 11/30/2016  ? normal EKG, MD note in Epic  states suspected underlying anxiety  ? Hypertension 01/11/2021  ? Insomnia 2022  ? Obesity   ? Subconjunctival hematoma, left 04/03/2011  ? Wears glasses   ? ? ?Past Surgical History:  ?Procedure Laterality Date  ? COLONOSCOPY  02/13/2021  ? colon polyps  ?  POLYPECTOMY    ? TONSILLECTOMY AND ADENOIDECTOMY    ? When pt was around 51 years old.  ? ? ?Family History  ?Problem Relation Age of Onset  ? Multiple myeloma Mother 35  ?     died at age 87  ? Drug abuse Father   ?     died at age 4. also had cancer   ? Cancer Father   ?     unknown kind   ? Brain cancer Other   ?     neice  ? Breast cancer Neg Hx   ? Colon cancer Neg Hx   ? Colon polyps Neg Hx   ? Esophageal cancer Neg Hx   ? Stomach cancer Neg Hx   ? Rectal cancer Neg Hx   ? ? ?Social History:  reports that she quit smoking about 12 years ago. Her smoking use included cigarettes. She started smoking about 35 years ago. She has a 15.00 pack-year smoking history. She has never used smokeless tobacco. She reports current alcohol use of about 5.0 standard drinks per week. She reports that she does not currently use drugs after having used the following drugs: Marijuana. ? ?Allergies: No Known Allergies ? ?No medications prior to admission.  ? ? ?Review of Systems ? ?Weight 110.7 kg, last menstrual period 05/12/2021. ?Physical Exam ?Constitutional:   ?   Appearance: Normal appearance.  ?HENT:  ?   Head: Normocephalic and atraumatic.  ?Pulmonary:  ?  Effort: Pulmonary effort is normal.  ?Abdominal:  ?   Palpations: Abdomen is soft. There is mass.  ?   Tenderness: There is no guarding or rebound.  ?Musculoskeletal:     ?   General: Normal range of motion.  ?   Cervical back: Normal range of motion.  ?Skin: ?   General: Skin is warm and dry.  ?Neurological:  ?   Mental Status: She is alert and oriented to person, place, and time.  ?Psychiatric:     ?   Mood and Affect: Mood normal.     ?   Behavior: Behavior normal.  ? ? ?Results for orders placed or performed during the hospital encounter of 06/08/21 (from the past 24 hour(s))  ?Type and screen [facility]     Status: None  ? Collection Time: 06/08/21 10:13 AM  ?Result Value Ref Range  ? ABO/RH(D) A POS   ? Antibody Screen NEG   ? Sample Expiration  06/22/2021,2359   ? Extend sample reason    ?  NO TRANSFUSIONS OR PREGNANCY IN THE PAST 3 MONTHS ?Performed at [facility], [place] [address] [place], [place] [address] ?  ?CBC     Status: None  ? Collection Time: 06/08/21 10:13 AM  ?Result Value Ref Range  ? WBC 7.4 4.0 - 10.5 K/uL  ? RBC 5.07 3.87 - 5.11 MIL/uL  ? Hemoglobin 13.6 12.0 - 15.0 g/dL  ? HCT 42.2 36.0 - 46.0 %  ? MCV 83.2 80.0 - 100.0 fL  ? MCH 26.8 26.0 - 34.0 pg  ? MCHC 32.2 30.0 - 36.0 g/dL  ? RDW 15.0 11.5 - 15.5 %  ? Platelets 329 150 - 400 K/uL  ? nRBC 0.0 0.0 - 0.2 %  ?Comprehensive metabolic panel     Status: Abnormal  ? Collection Time: 06/08/21 10:13 AM  ?Result Value Ref Range  ? Sodium 135 135 - 145 mmol/L  ? Potassium 4.1 3.5 - 5.1 mmol/L  ? Chloride 104 98 - 111 mmol/L  ? CO2 25 22 - 32 mmol/L  ? Glucose, Bld 111 (H) 70 - 99 mg/dL  ? BUN 12 6 - 20 mg/dL  ? Creatinine, Ser 0.72 0.44 - 1.00 mg/dL  ? Calcium 9.1 8.9 - 10.3 mg/dL  ? Total Protein 7.7 6.5 - 8.1 g/dL  ? Albumin 3.9 3.5 - 5.0 g/dL  ? AST 16 15 - 41 U/L  ? ALT 16 0 - 44 U/L  ? Alkaline Phosphatase 43 38 - 126 U/L  ? Total Bilirubin 0.5 0.3 - 1.2 mg/dL  ? GFR, Estimated >60 >60 mL/min  ? Anion gap 6 5 - 15  ? ? ?No results found. ? ?Assessment/Plan: ?51yo G0 with symptomatic uterine leiomyomata and bilateral ovarian cysts, declines conservative treatment ?Plan TAH, bilateral salpingectomy, possible bilateral ovarian cystectomy, possible bilateral oophorectomy ?Patient is counseled re: surgery to include risk of bleeding, infection, scarring and damage to surrounding structures. She is informed of steps of procedure as well as postop expectations and limitations. She is counseled that should her ovaries appear wnl, will recommend ovarian preservation and patient agrees. All questions were answered and patient wishes to proceed. ? ?Jenny Morales ?06/09/2021, 5:57 AM ? ?

## 2021-06-11 ENCOUNTER — Encounter (HOSPITAL_BASED_OUTPATIENT_CLINIC_OR_DEPARTMENT_OTHER): Payer: Self-pay | Admitting: Obstetrics & Gynecology

## 2021-06-11 NOTE — Anesthesia Preprocedure Evaluation (Addendum)
Anesthesia Evaluation  ?Patient identified by MRN, date of birth, ID band ?Patient awake ? ? ? ?Reviewed: ?Allergy & Precautions, NPO status , Patient's Chart, lab work & pertinent test results, reviewed documented beta blocker date and time  ? ?Airway ?Mallampati: II ? ?TM Distance: >3 FB ?Neck ROM: Full ? ? ? Dental ?no notable dental hx. ?(+) Teeth Intact, Dental Advisory Given ?  ?Pulmonary ?former smoker,  ?  ?Pulmonary exam normal ?breath sounds clear to auscultation ? ? ? ? ? ? Cardiovascular ?hypertension, Pt. on medications ?Normal cardiovascular exam ?Rhythm:Regular Rate:Normal ? ? ?  ?Neuro/Psych ?PSYCHIATRIC DISORDERS Anxiety negative neurological ROS ?   ? GI/Hepatic ?negative GI ROS, Neg liver ROS,   ?Endo/Other  ?Morbid obesity ? Renal/GU ?negative Renal ROS  ?negative genitourinary ?  ?Musculoskeletal ? ?(+) Arthritis , Osteoarthritis,   ? Abdominal ?(+) + obese,   ?Peds ? Hematology ?negative hematology ROS ?(+)   ?Anesthesia Other Findings ? ? Reproductive/Obstetrics ?Fibroid uterus ?Bilateral ovarian cysts ?AUB ? ?  ? ? ? ? ? ? ? ? ? ? ? ? ? ?  ?  ? ? ? ? ? ? ? ?Anesthesia Physical ?Anesthesia Plan ? ?ASA: 3 ? ?Anesthesia Plan: General  ? ?Post-op Pain Management: Tylenol PO (pre-op)*, Ketamine IV*, Precedex and Dilaudid IV  ? ?Induction:  ? ?PONV Risk Score and Plan: 4 or greater and Treatment may vary due to age or medical condition, Scopolamine patch - Pre-op, Midazolam, Ondansetron and Dexamethasone ? ?Airway Management Planned: Oral ETT ? ?Additional Equipment: None ? ?Intra-op Plan:  ? ?Post-operative Plan: Extubation in OR ? ?Informed Consent: I have reviewed the patients History and Physical, chart, labs and discussed the procedure including the risks, benefits and alternatives for the proposed anesthesia with the patient or authorized representative who has indicated his/her understanding and acceptance.  ? ? ? ?Dental advisory given ? ?Plan Discussed  with: CRNA and Anesthesiologist ? ?Anesthesia Plan Comments:   ? ? ? ? ? ?Anesthesia Quick Evaluation ? ?

## 2021-06-12 ENCOUNTER — Other Ambulatory Visit: Payer: Self-pay

## 2021-06-12 ENCOUNTER — Ambulatory Visit (HOSPITAL_BASED_OUTPATIENT_CLINIC_OR_DEPARTMENT_OTHER): Payer: BC Managed Care – PPO | Admitting: Anesthesiology

## 2021-06-12 ENCOUNTER — Encounter (HOSPITAL_BASED_OUTPATIENT_CLINIC_OR_DEPARTMENT_OTHER): Payer: Self-pay | Admitting: Obstetrics & Gynecology

## 2021-06-12 ENCOUNTER — Observation Stay (HOSPITAL_BASED_OUTPATIENT_CLINIC_OR_DEPARTMENT_OTHER)
Admission: RE | Admit: 2021-06-12 | Discharge: 2021-06-13 | Disposition: A | Discharge status: Home / Self Care | Payer: BC Managed Care – PPO | Attending: Obstetrics & Gynecology | Admitting: Obstetrics & Gynecology

## 2021-06-12 ENCOUNTER — Encounter (HOSPITAL_BASED_OUTPATIENT_CLINIC_OR_DEPARTMENT_OTHER)
Admission: RE | Disposition: A | Discharge status: Home / Self Care | Payer: Self-pay | Source: Home / Self Care | Attending: Obstetrics & Gynecology

## 2021-06-12 DIAGNOSIS — L74512 Primary focal hyperhidrosis, palms: Secondary | ICD-10-CM

## 2021-06-12 DIAGNOSIS — I10 Essential (primary) hypertension: Secondary | ICD-10-CM | POA: Insufficient documentation

## 2021-06-12 DIAGNOSIS — N83201 Unspecified ovarian cyst, right side: Secondary | ICD-10-CM | POA: Diagnosis not present

## 2021-06-12 DIAGNOSIS — Z90710 Acquired absence of both cervix and uterus: Secondary | ICD-10-CM | POA: Diagnosis present

## 2021-06-12 DIAGNOSIS — D219 Benign neoplasm of connective and other soft tissue, unspecified: Secondary | ICD-10-CM

## 2021-06-12 DIAGNOSIS — N858 Other specified noninflammatory disorders of uterus: Secondary | ICD-10-CM | POA: Diagnosis not present

## 2021-06-12 DIAGNOSIS — D259 Leiomyoma of uterus, unspecified: Secondary | ICD-10-CM | POA: Diagnosis not present

## 2021-06-12 DIAGNOSIS — N939 Abnormal uterine and vaginal bleeding, unspecified: Secondary | ICD-10-CM

## 2021-06-12 DIAGNOSIS — Z87891 Personal history of nicotine dependence: Secondary | ICD-10-CM | POA: Insufficient documentation

## 2021-06-12 DIAGNOSIS — L710 Perioral dermatitis: Secondary | ICD-10-CM

## 2021-06-12 DIAGNOSIS — N83202 Unspecified ovarian cyst, left side: Secondary | ICD-10-CM | POA: Diagnosis not present

## 2021-06-12 DIAGNOSIS — F5101 Primary insomnia: Secondary | ICD-10-CM

## 2021-06-12 DIAGNOSIS — F411 Generalized anxiety disorder: Secondary | ICD-10-CM

## 2021-06-12 DIAGNOSIS — Z01818 Encounter for other preprocedural examination: Secondary | ICD-10-CM

## 2021-06-12 DIAGNOSIS — D4959 Neoplasm of unspecified behavior of other genitourinary organ: Secondary | ICD-10-CM | POA: Diagnosis not present

## 2021-06-12 HISTORY — PX: SALPINGOOPHORECTOMY: SHX82

## 2021-06-12 HISTORY — DX: Benign neoplasm of connective and other soft tissue, unspecified: D21.9

## 2021-06-12 HISTORY — PX: HYSTERECTOMY ABDOMINAL WITH SALPINGECTOMY: SHX6725

## 2021-06-12 HISTORY — DX: Presence of spectacles and contact lenses: Z97.3

## 2021-06-12 HISTORY — DX: Cardiac murmur, unspecified: R01.1

## 2021-06-12 LAB — ABO/RH: ABO/RH(D): A POS

## 2021-06-12 LAB — POCT PREGNANCY, URINE: Preg Test, Ur: NEGATIVE

## 2021-06-12 LAB — TYPE AND SCREEN
ABO/RH(D): A POS
Antibody Screen: NEGATIVE

## 2021-06-12 SURGERY — HYSTERECTOMY, TOTAL, ABDOMINAL, WITH SALPINGECTOMY
Anesthesia: General | Site: Abdomen | Laterality: Bilateral

## 2021-06-12 MED ORDER — ACETAMINOPHEN 500 MG PO TABS
ORAL_TABLET | ORAL | Status: AC
Start: 2021-06-12 — End: ?
  Filled 2021-06-12: qty 2

## 2021-06-12 MED ORDER — FENTANYL CITRATE (PF) 100 MCG/2ML IJ SOLN
INTRAMUSCULAR | Status: AC
  Filled 2021-06-12: qty 2

## 2021-06-12 MED ORDER — CEFAZOLIN SODIUM-DEXTROSE 2-4 GM/100ML-% IV SOLN
INTRAVENOUS | Status: AC
  Filled 2021-06-12: qty 100

## 2021-06-12 MED ORDER — CEFAZOLIN SODIUM-DEXTROSE 2-4 GM/100ML-% IV SOLN
2.0000 g | INTRAVENOUS | Status: AC
  Administered 2021-06-12: 2 g via INTRAVENOUS

## 2021-06-12 MED ORDER — LIDOCAINE HCL (CARDIAC) PF 100 MG/5ML IV SOSY
PREFILLED_SYRINGE | INTRAVENOUS | Status: DC | PRN
  Administered 2021-06-12: 60 mg via INTRAVENOUS

## 2021-06-12 MED ORDER — SCOPOLAMINE 1 MG/3DAYS TD PT72
MEDICATED_PATCH | TRANSDERMAL | Status: AC
  Filled 2021-06-12: qty 1

## 2021-06-12 MED ORDER — DEXAMETHASONE SODIUM PHOSPHATE 10 MG/ML IJ SOLN
INTRAMUSCULAR | Status: AC
  Filled 2021-06-12: qty 1

## 2021-06-12 MED ORDER — SUGAMMADEX SODIUM 200 MG/2ML IV SOLN
INTRAVENOUS | Status: DC | PRN
  Administered 2021-06-12: 230 mg via INTRAVENOUS

## 2021-06-12 MED ORDER — ONDANSETRON HCL 4 MG/2ML IJ SOLN: 4.0000 mg | Freq: Once | INTRAMUSCULAR | Status: DC | PRN

## 2021-06-12 MED ORDER — DOCUSATE SODIUM 100 MG PO CAPS
ORAL_CAPSULE | ORAL | Status: AC
  Filled 2021-06-12: qty 1

## 2021-06-12 MED ORDER — OXYCODONE HCL 5 MG PO TABS
ORAL_TABLET | ORAL | Status: AC
Start: 2021-06-12 — End: ?
  Filled 2021-06-12: qty 2

## 2021-06-12 MED ORDER — PROPOFOL 1000 MG/100ML IV EMUL
INTRAVENOUS | Status: AC
  Filled 2021-06-12: qty 100

## 2021-06-12 MED ORDER — ONDANSETRON HCL 4 MG/2ML IJ SOLN: 4.0000 mg | Freq: Four times a day (QID) | INTRAMUSCULAR | Status: DC | PRN

## 2021-06-12 MED ORDER — ACETAMINOPHEN 500 MG PO TABS
ORAL_TABLET | ORAL | Status: AC
  Filled 2021-06-12: qty 2

## 2021-06-12 MED ORDER — PROPOFOL 10 MG/ML IV BOLUS
INTRAVENOUS | Status: AC
  Filled 2021-06-12: qty 20

## 2021-06-12 MED ORDER — ONDANSETRON HCL 4 MG/2ML IJ SOLN
INTRAMUSCULAR | Status: AC
  Filled 2021-06-12: qty 6

## 2021-06-12 MED ORDER — ROCURONIUM BROMIDE 100 MG/10ML IV SOLN
INTRAVENOUS | Status: DC | PRN
  Administered 2021-06-12: 10 mg via INTRAVENOUS
  Administered 2021-06-12: 70 mg via INTRAVENOUS

## 2021-06-12 MED ORDER — FENTANYL CITRATE (PF) 100 MCG/2ML IJ SOLN
INTRAMUSCULAR | Status: DC | PRN
  Administered 2021-06-12 (×2): 50 ug via INTRAVENOUS
  Administered 2021-06-12: 150 ug via INTRAVENOUS

## 2021-06-12 MED ORDER — HYDROMORPHONE HCL 1 MG/ML IJ SOLN
INTRAMUSCULAR | Status: AC
  Filled 2021-06-12: qty 1

## 2021-06-12 MED ORDER — ONDANSETRON HCL 4 MG PO TABS: 4.0000 mg | ORAL_TABLET | Freq: Four times a day (QID) | ORAL | Status: DC | PRN

## 2021-06-12 MED ORDER — HYDROMORPHONE HCL 1 MG/ML IJ SOLN
INTRAMUSCULAR | Status: AC
Start: 2021-06-12 — End: ?
  Filled 2021-06-12: qty 1

## 2021-06-12 MED ORDER — HYDROMORPHONE HCL 1 MG/ML IJ SOLN
0.2500 mg | INTRAMUSCULAR | Status: DC | PRN
  Administered 2021-06-12: 0.25 mg via INTRAVENOUS
  Administered 2021-06-12: 0.5 mg via INTRAVENOUS
  Administered 2021-06-12: 0.25 mg via INTRAVENOUS
  Administered 2021-06-12 (×2): 0.5 mg via INTRAVENOUS

## 2021-06-12 MED ORDER — MORPHINE SULFATE (PF) 2 MG/ML IV SOLN
INTRAVENOUS | Status: AC
  Filled 2021-06-12: qty 1

## 2021-06-12 MED ORDER — KETOROLAC TROMETHAMINE 30 MG/ML IJ SOLN
30.0000 mg | Freq: Four times a day (QID) | INTRAMUSCULAR | Status: DC
  Administered 2021-06-12 – 2021-06-13 (×3): 30 mg via INTRAVENOUS

## 2021-06-12 MED ORDER — KETOROLAC TROMETHAMINE 30 MG/ML IJ SOLN
INTRAMUSCULAR | Status: DC | PRN
  Administered 2021-06-12: 30 mg via INTRAVENOUS

## 2021-06-12 MED ORDER — MIDAZOLAM HCL 2 MG/2ML IJ SOLN
INTRAMUSCULAR | Status: DC | PRN
  Administered 2021-06-12: 2 mg via INTRAVENOUS

## 2021-06-12 MED ORDER — DEXMEDETOMIDINE (PRECEDEX) IN NS 20 MCG/5ML (4 MCG/ML) IV SYRINGE
PREFILLED_SYRINGE | INTRAVENOUS | Status: DC | PRN
  Administered 2021-06-12: 4 ug via INTRAVENOUS
  Administered 2021-06-12: 8 ug via INTRAVENOUS
  Administered 2021-06-12: 4 ug via INTRAVENOUS

## 2021-06-12 MED ORDER — LACTATED RINGERS IV SOLN: INTRAVENOUS | Status: DC

## 2021-06-12 MED ORDER — SCOPOLAMINE 1 MG/3DAYS TD PT72
1.0000 | MEDICATED_PATCH | TRANSDERMAL | Status: DC
  Administered 2021-06-12: 1.5 mg via TRANSDERMAL

## 2021-06-12 MED ORDER — OXYCODONE HCL 5 MG/5ML PO SOLN: 5.0000 mg | Freq: Once | ORAL | Status: DC | PRN

## 2021-06-12 MED ORDER — KETOROLAC TROMETHAMINE 30 MG/ML IJ SOLN
INTRAMUSCULAR | Status: AC
  Filled 2021-06-12: qty 1

## 2021-06-12 MED ORDER — 0.9 % SODIUM CHLORIDE (POUR BTL) OPTIME
TOPICAL | Status: DC | PRN
  Administered 2021-06-12: 1000 mL

## 2021-06-12 MED ORDER — GLYCOPYRROLATE 0.2 MG/ML IJ SOLN
INTRAMUSCULAR | Status: DC | PRN
  Administered 2021-06-12: .1 mg via INTRAVENOUS

## 2021-06-12 MED ORDER — MORPHINE SULFATE (PF) 4 MG/ML IV SOLN
INTRAVENOUS | Status: AC
Start: 2021-06-12 — End: ?
  Filled 2021-06-12: qty 1

## 2021-06-12 MED ORDER — AMISULPRIDE (ANTIEMETIC) 5 MG/2ML IV SOLN
10.0000 mg | Freq: Once | INTRAVENOUS | Status: AC | PRN
  Administered 2021-06-12: 10 mg via INTRAVENOUS

## 2021-06-12 MED ORDER — MENTHOL 3 MG MT LOZG: 1.0000 | LOZENGE | OROMUCOSAL | Status: DC | PRN

## 2021-06-12 MED ORDER — OXYCODONE HCL 5 MG PO TABS
ORAL_TABLET | ORAL | Status: AC
  Filled 2021-06-12: qty 2

## 2021-06-12 MED ORDER — OXYCODONE HCL 5 MG PO TABS
5.0000 mg | ORAL_TABLET | ORAL | Status: DC | PRN
  Administered 2021-06-12 (×3): 10 mg via ORAL
  Administered 2021-06-13: 5 mg via ORAL
  Administered 2021-06-13: 10 mg via ORAL

## 2021-06-12 MED ORDER — LIDOCAINE HCL (PF) 2 % IJ SOLN
INTRAMUSCULAR | Status: AC
  Filled 2021-06-12: qty 5

## 2021-06-12 MED ORDER — SIMETHICONE 80 MG PO CHEW: 80.0000 mg | CHEWABLE_TABLET | Freq: Four times a day (QID) | ORAL | Status: DC | PRN

## 2021-06-12 MED ORDER — ONDANSETRON HCL 4 MG/2ML IJ SOLN
INTRAMUSCULAR | Status: DC | PRN
  Administered 2021-06-12: 4 mg via INTRAVENOUS

## 2021-06-12 MED ORDER — MIDAZOLAM HCL 2 MG/2ML IJ SOLN
INTRAMUSCULAR | Status: AC
  Filled 2021-06-12: qty 2

## 2021-06-12 MED ORDER — ACETAMINOPHEN 500 MG PO TABS
1000.0000 mg | ORAL_TABLET | Freq: Once | ORAL | Status: AC
Start: 2021-06-12 — End: 2021-06-12
  Administered 2021-06-12: 1000 mg via ORAL

## 2021-06-12 MED ORDER — MORPHINE SULFATE (PF) 4 MG/ML IV SOLN
1.0000 mg | INTRAVENOUS | Status: DC | PRN
  Administered 2021-06-12 (×4): 1 mg via INTRAVENOUS

## 2021-06-12 MED ORDER — HYDROMORPHONE HCL 1 MG/ML IJ SOLN: 0.2500 mg | INTRAMUSCULAR | Status: DC | PRN

## 2021-06-12 MED ORDER — ROCURONIUM BROMIDE 10 MG/ML (PF) SYRINGE
PREFILLED_SYRINGE | INTRAVENOUS | Status: AC
  Filled 2021-06-12: qty 10

## 2021-06-12 MED ORDER — OXYCODONE HCL 5 MG PO TABS: 5.0000 mg | ORAL_TABLET | Freq: Once | ORAL | Status: DC | PRN

## 2021-06-12 MED ORDER — KETAMINE HCL 10 MG/ML IJ SOLN
INTRAMUSCULAR | Status: DC | PRN
  Administered 2021-06-12: 20 mg via INTRAVENOUS

## 2021-06-12 MED ORDER — ONDANSETRON HCL 4 MG/2ML IJ SOLN
INTRAMUSCULAR | Status: AC
Start: 2021-06-12 — End: ?
  Filled 2021-06-12: qty 2

## 2021-06-12 MED ORDER — HYDROMORPHONE HCL 1 MG/ML IJ SOLN
0.2000 mg | INTRAMUSCULAR | Status: DC | PRN
  Administered 2021-06-12 (×2): 0.6 mg via INTRAVENOUS

## 2021-06-12 MED ORDER — DEXAMETHASONE SODIUM PHOSPHATE 10 MG/ML IJ SOLN
INTRAMUSCULAR | Status: AC
  Filled 2021-06-12: qty 3

## 2021-06-12 MED ORDER — DEXAMETHASONE SODIUM PHOSPHATE 4 MG/ML IJ SOLN
INTRAMUSCULAR | Status: DC | PRN
  Administered 2021-06-12: 10 mg via INTRAVENOUS

## 2021-06-12 MED ORDER — AMISULPRIDE (ANTIEMETIC) 5 MG/2ML IV SOLN: 10.0000 mg | Freq: Once | INTRAVENOUS | Status: DC | PRN

## 2021-06-12 MED ORDER — FENTANYL CITRATE (PF) 250 MCG/5ML IJ SOLN
INTRAMUSCULAR | Status: AC
  Filled 2021-06-12: qty 5

## 2021-06-12 MED ORDER — PROPOFOL 10 MG/ML IV BOLUS
INTRAVENOUS | Status: DC | PRN
  Administered 2021-06-12: 30 mg via INTRAVENOUS
  Administered 2021-06-12: 150 mg via INTRAVENOUS

## 2021-06-12 MED ORDER — KETAMINE HCL 50 MG/5ML IJ SOSY
PREFILLED_SYRINGE | INTRAMUSCULAR | Status: AC
  Filled 2021-06-12: qty 5

## 2021-06-12 MED ORDER — POVIDONE-IODINE 10 % EX SWAB
2.0000 "application " | Freq: Once | CUTANEOUS | Status: AC
  Administered 2021-06-12: 2 via TOPICAL

## 2021-06-12 MED ORDER — AMISULPRIDE (ANTIEMETIC) 5 MG/2ML IV SOLN
INTRAVENOUS | Status: AC
  Filled 2021-06-12: qty 4

## 2021-06-12 MED ORDER — DOCUSATE SODIUM 100 MG PO CAPS
100.0000 mg | ORAL_CAPSULE | Freq: Two times a day (BID) | ORAL | Status: DC
  Administered 2021-06-12 (×2): 100 mg via ORAL

## 2021-06-12 MED ORDER — ACETAMINOPHEN 500 MG PO TABS
1000.0000 mg | ORAL_TABLET | Freq: Four times a day (QID) | ORAL | Status: DC
  Administered 2021-06-12 – 2021-06-13 (×4): 1000 mg via ORAL

## 2021-06-12 SURGICAL SUPPLY — 37 items
APL SKNCLS STERI-STRIP NONHPOA (GAUZE/BANDAGES/DRESSINGS) ×2
BENZOIN TINCTURE PRP APPL 2/3 (GAUZE/BANDAGES/DRESSINGS) ×3 IMPLANT
BLADE CLIPPER SENSICLIP SURGIC (BLADE) IMPLANT
DECANTER SPIKE VIAL GLASS SM (MISCELLANEOUS) IMPLANT
DRAPE CESAREAN BIRTH W POUCH (DRAPES) ×3 IMPLANT
DRAPE WARM FLUID 44X44 (DRAPES) ×3 IMPLANT
DRSG OPSITE POSTOP 4X10 (GAUZE/BANDAGES/DRESSINGS) ×3 IMPLANT
DRSG TELFA 3X8 NADH (GAUZE/BANDAGES/DRESSINGS) IMPLANT
DURAPREP 26ML APPLICATOR (WOUND CARE) ×3 IMPLANT
ELECT REM PT RETURN 9FT ADLT (ELECTROSURGICAL) ×3
ELECTRODE REM PT RTRN 9FT ADLT (ELECTROSURGICAL) ×2 IMPLANT
GAUZE 4X4 16PLY ~~LOC~~+RFID DBL (SPONGE) ×3 IMPLANT
GLOVE SURG POLYISO LF SZ5.5 (GLOVE) ×3 IMPLANT
GLOVE SURG UNDER POLY LF SZ6 (GLOVE) ×3 IMPLANT
GOWN STRL REUS W/TWL LRG LVL3 (GOWN DISPOSABLE) ×3 IMPLANT
HEMOSTAT SURGICEL 4X8 (HEMOSTASIS) IMPLANT
HOLDER FOLEY CATH W/STRAP (MISCELLANEOUS) IMPLANT
KIT TURNOVER CYSTO (KITS) ×3 IMPLANT
NS IRRIG 500ML POUR BTL (IV SOLUTION) ×9 IMPLANT
PACK ABDOMINAL GYN (CUSTOM PROCEDURE TRAY) ×3 IMPLANT
PAD DRESSING TELFA 3X8 NADH (GAUZE/BANDAGES/DRESSINGS) IMPLANT
PAD OB MATERNITY 4.3X12.25 (PERSONAL CARE ITEMS) ×3 IMPLANT
SPONGE T-LAP 18X18 ~~LOC~~+RFID (SPONGE) ×3 IMPLANT
STRIP CLOSURE SKIN 1/2X4 (GAUZE/BANDAGES/DRESSINGS) ×3 IMPLANT
SUT MNCRL 0 MO-4 VIOLET 18 CR (SUTURE) ×5 IMPLANT
SUT MNCRL 0 VIOLET 6X18 (SUTURE) ×2 IMPLANT
SUT MON AB 2-0 CT1 36 (SUTURE) IMPLANT
SUT MON AB-0 CT1 36 (SUTURE) ×3 IMPLANT
SUT MONOCRYL 0 6X18 (SUTURE) ×3
SUT MONOCRYL 0 MO 4 18  CR/8 (SUTURE) ×9
SUT PDS AB 0 CT1 27 (SUTURE) ×6 IMPLANT
SUT PLAIN 2 0 XLH (SUTURE) ×3 IMPLANT
SUT VIC AB 0 CT1 36 (SUTURE) ×4 IMPLANT
SUT VIC AB 4-0 KS 27 (SUTURE) ×3 IMPLANT
TOWEL OR 17X26 10 PK STRL BLUE (TOWEL DISPOSABLE) ×3 IMPLANT
TRAY FOLEY W/BAG SLVR 14FR LF (SET/KITS/TRAYS/PACK) ×3 IMPLANT
WATER STERILE IRR 500ML POUR (IV SOLUTION) IMPLANT

## 2021-06-12 NOTE — Transfer of Care (Signed)
Immediate Anesthesia Transfer of Care Note ? ?Patient: Jenny Morales ? ?Procedure(s) Performed: TOTAL ABDOMINAL HYSTERECTOMY, BILATERAL SALPINGECTOMY (Bilateral: Abdomen) ?OPEN SALPINGO OOPHORECTOMY (Bilateral: Abdomen) ? ?Patient Location: PACU ? ?Anesthesia Type:General ? ?Level of Consciousness: awake and patient cooperative ? ?Airway & Oxygen Therapy: Patient Spontanous Breathing and Patient connected to nasal cannula oxygen ? ?Post-op Assessment: Report given to RN and Post -op Vital signs reviewed and stable ? ?Post vital signs: Reviewed and stable ? ?Last Vitals:  ?Vitals Value Taken Time  ?BP 132/77 06/12/21 0920  ?Temp    ?Pulse 74 06/12/21 0921  ?Resp 9 06/12/21 0921  ?SpO2 99 % 06/12/21 0921  ?Vitals shown include unvalidated device data. ? ?Last Pain:  ?Vitals:  ? 06/12/21 0621  ?TempSrc: Oral  ?PainSc: 0-No pain  ?   ? ?Patients Stated Pain Goal: 5 (06/12/21 7711) ? ?Complications: No notable events documented. ?

## 2021-06-12 NOTE — Op Note (Signed)
Jenny Morales ?PROCEDURE DATE: 06/12/2021 ? ?PREOPERATIVE DIAGNOSIS:  Symptomatic fibroids, bilateral ovarian cysts ?POSTOPERATIVE DIAGNOSIS:  Symptomatic fibroids, bilateral ovarian cysts ?SURGEON:   Dr. Linda Hedges ?ASSISTANT:   Dr. Molli Posey ?OPERATION:  Total abdominal hysterectomy, bilateral salpingo-oophorectomy ?ANESTHESIA:  General endotracheal. ? ?INDICATIONS: The patient is a 51 y.o. G0 with history of symptomatic uterine fibroids and incidentally found bilateral ovarian cysts. The patient made a decision to undergo definite surgical treatment. On the preoperative visit, the risks, benefits, indications, and alternatives of the procedure were reviewed with the patient.  On the day of surgery, the risks of surgery were again discussed with the patient including but not limited to: bleeding which may require transfusion or reoperation; infection which may require antibiotics; injury to bowel, bladder, ureters or other surrounding organs; need for additional procedures; thromboembolic phenomenon, incisional problems and other postoperative/anesthesia complications. Written informed consent was obtained.   ? ?OPERATIVE FINDINGS: A 18 week size uterus with normal tubes bilaterally.  Left ovary is enlarged with no clearly identifiable normal ovarian stroma; measures about 8 cm in maximal dimension.  Right ovary is densely adherent to right pelvic side wall and posterior culdesac.   ? ?ESTIMATED BLOOD LOSS: 500 ml ?SPECIMENS:  Uterus, bilateral fallopian tubes and ovaries sent to pathology ?COMPLICATIONS:  None immediate. ? ? ?DESCRIPTION OF PROCEDURE: The patient received intravenous antibiotics and had sequential compression devices applied to her lower extremities while in the preoperative area.   She was taken to the operating room and placed under general anesthesia without difficulty and found to be adequate.The abdomen and perineum were prepped and draped in a sterile manner, and a Foley catheter  was inserted into the bladder and attached to constant drainage. After an adequate timeout was performed, a Pfannensteil skin incision was made. This incision was taken down to the fascia using electrocautery with care given to maintain good hemostasis. The fascia was incised in the midline and the fascial incision was then extended bilaterally using electrocautery without difficulty. The fascia was then dissected off the underlying rectus muscles using blunt and sharp dissection. The rectus muscles were split bluntly in the midline and the peritoneum entered sharply without complication. This peritoneal incision was then extended superiorly and inferiorly with care given to prevent bowel or bladder injury. Upon entry into the abdominal cavity, the upper abdomen was inspected and found to be normal. Attention was then turned to the pelvis. The enlarged, fibroid uterus was elevated out of the pelvis.  A large pedunculated fundal fibroid was identified and to allow better mobility and visualization, was removed.  The pedicle was clamped and suture ligated to hemostasis.  The round ligaments on each side were clamped, suture ligated, and transected with electrocautery allowing entry into the broad ligament. The anterior and posterior leaves of the broad ligament were separated and the ureters were inspected to be safely away from the area of dissection bilaterally. A hole was created in the clear portion of the posterior broad ligament. Adnexae were clamped on the patient's right side. This pedicle was then clamped, cut, and doubly suture ligated with good hemostasis. This procedure was repeated in an identical fashion on the left site allowing for both adnexa to temporarily remain in place.  A bladder flap was then created across the anterior leaf of the broad ligament and the bladder was then bluntly dissected off the lower uterine segment and cervix with good hemostasis. The uterine arteries were then skeletonized  bilaterally and then clamped, cut, and  ligated with care given to prevent ureteral injury. The uterosacral ligaments were then clamped, cut, and ligated bilaterally. To allow for better visualization, the uterus was then amputated across the lower uterine segment temporarily leaving the cervix intact. The specimen was sent to pathology.  Finally, the cardinal ligaments were clamped, cut, and ligated bilaterally.  Acutely curved clamps were placed across the vagina just under the cervix, and the specimen was amputated and sent to pathology. The vaginal cuff was closed with a series of interrupted 0 monocryl figure-of-eight sutures with care given to incorporate the anterior pubocervical fascia and the posterior rectovaginal fascia.  The vaginal cuff angles were noted to have good hemostasis.  Attention was not returned to the remaining right and left fallopian tube and ovary.  The enlarged left ovary was elevated.  This pedicle was then clamped, cut, and doubly suture ligated with good hemostasis.  The right fallopian tube and ovary were densely adhere to the right pelvic side was and in the culdesac.  Using blunt and sharp dissection, this ovary and tube were freed.  During dissection, cyst fluid spilled which had chocolate appearance.  The right fallopian tube and ovary were elevated and the pedicle was clamped, cut and doubly suture ligated with good hemostasis.  The pelvis was irrigated and hemostasis was reconfirmed at all pedicles and along the pelvic sidewall.  All laparotomy sponges and instruments were removed from the abdomen. The peritoneum was closed with 2-0 monocryl, and the fascia was closed with 0 Vicryl in a running fashion. The subcutaneous layer was irrigated. The skin was closed with a 4-0 vicryl subcuticular stitch. Sponge, lap, needle, and instrument counts were correct times two. The patient was taken to the recovery area awake, extubated and in stable condition. ?

## 2021-06-12 NOTE — Anesthesia Procedure Notes (Signed)
Procedure Name: Intubation ?Date/Time: 06/12/2021 7:33 AM ?Performed by: Georgeanne Nim, CRNA ?Pre-anesthesia Checklist: Patient identified, Emergency Drugs available, Suction available, Patient being monitored and Timeout performed ?Patient Re-evaluated:Patient Re-evaluated prior to induction ?Oxygen Delivery Method: Circle system utilized ?Preoxygenation: Pre-oxygenation with 100% oxygen ?Induction Type: IV induction ?Ventilation: Mask ventilation without difficulty ?Laryngoscope Size: Mac and 4 ?Grade View: Grade I ?Tube type: Oral ?Tube size: 7.0 mm ?Number of attempts: 1 ?Placement Confirmation: ETT inserted through vocal cords under direct vision, positive ETCO2, CO2 detector and breath sounds checked- equal and bilateral ?Secured at: 21 cm ?Tube secured with: Tape ?Dental Injury: Teeth and Oropharynx as per pre-operative assessment  ? ? ? ? ?

## 2021-06-12 NOTE — Progress Notes (Signed)
Day of Surgery Procedure(s) (LRB): ?TOTAL ABDOMINAL HYSTERECTOMY, BILATERAL SALPINGECTOMY (Bilateral) ?OPEN SALPINGO OOPHORECTOMY (Bilateral) ? ?Subjective: ?Patient reports incisional pain and tolerating PO.  Patient reports pain medication makes her sleepy but pain is still there; at incision.  Patient received Oxi x 2 about 30 minutes ago.   ? ?Objective: ?I have reviewed patient's vital signs, intake and output, and medications. ? ?General: alert, cooperative, and appears stated age ?Extremities: extremities normal, atraumatic, no cyanosis or edema ?Abd: soft, appropriately tender to palpation.  Honeycomb dressing c/d/i ? ?Assessment: ?s/p Procedure(s): ?TOTAL ABDOMINAL HYSTERECTOMY, BILATERAL SALPINGECTOMY (Bilateral) ?OPEN SALPINGO OOPHORECTOMY (Bilateral): stable ? ?Plan: ?Advance diet ?Encourage ambulation ?Will continue to improve postop pain management.  Will try IV Dilaudid to bridge until Toradol dose due at 1530 ?AM labs pending ?D/C foley in AM ? ? LOS: 0 days  ? ? ?Jinny Blossom Denzil Mceachron ?06/12/2021, 2:06 PM ? ? ? ? ?

## 2021-06-12 NOTE — Progress Notes (Signed)
No change to H&P.  Shaylene Paganelli, DO 

## 2021-06-12 NOTE — Anesthesia Postprocedure Evaluation (Signed)
Anesthesia Post Note ? ?Patient: Jenny Morales ? ?Procedure(s) Performed: TOTAL ABDOMINAL HYSTERECTOMY, BILATERAL SALPINGECTOMY (Bilateral: Abdomen) ?OPEN SALPINGO OOPHORECTOMY (Bilateral: Abdomen) ? ?  ? ?Patient location during evaluation: PACU ?Anesthesia Type: General ?Level of consciousness: awake and alert and oriented ?Pain management: pain level controlled ?Vital Signs Assessment: post-procedure vital signs reviewed and stable ?Respiratory status: spontaneous breathing, nonlabored ventilation and respiratory function stable ?Cardiovascular status: blood pressure returned to baseline and stable ?Postop Assessment: no apparent nausea or vomiting ?Anesthetic complications: no ? ? ?No notable events documented. ? ?Last Vitals:  ?Vitals:  ? 06/12/21 0945 06/12/21 1000  ?BP: 125/74 128/72  ?Pulse: 71 71  ?Resp: 17 10  ?Temp:    ?SpO2: 98% 96%  ?  ?Last Pain:  ?Vitals:  ? 06/12/21 1020  ?TempSrc:   ?PainSc: 8   ? ? ?  ?  ?  ?  ?  ?  ? ?Jon Lall A. ? ? ? ? ?

## 2021-06-13 ENCOUNTER — Encounter (HOSPITAL_BASED_OUTPATIENT_CLINIC_OR_DEPARTMENT_OTHER): Payer: Self-pay | Admitting: Obstetrics & Gynecology

## 2021-06-13 DIAGNOSIS — I10 Essential (primary) hypertension: Secondary | ICD-10-CM | POA: Diagnosis not present

## 2021-06-13 DIAGNOSIS — N83201 Unspecified ovarian cyst, right side: Secondary | ICD-10-CM | POA: Diagnosis not present

## 2021-06-13 DIAGNOSIS — Z87891 Personal history of nicotine dependence: Secondary | ICD-10-CM | POA: Diagnosis not present

## 2021-06-13 DIAGNOSIS — D259 Leiomyoma of uterus, unspecified: Secondary | ICD-10-CM | POA: Diagnosis not present

## 2021-06-13 LAB — CBC
HCT: 34.6 % — ABNORMAL LOW (ref 36.0–46.0)
Hemoglobin: 11.6 g/dL — ABNORMAL LOW (ref 12.0–15.0)
MCH: 27.4 pg (ref 26.0–34.0)
MCHC: 33.5 g/dL (ref 30.0–36.0)
MCV: 81.8 fL (ref 80.0–100.0)
Platelets: 297 10*3/uL (ref 150–400)
RBC: 4.23 MIL/uL (ref 3.87–5.11)
RDW: 14.7 % (ref 11.5–15.5)
WBC: 13.2 10*3/uL — ABNORMAL HIGH (ref 4.0–10.5)
nRBC: 0 % (ref 0.0–0.2)

## 2021-06-13 LAB — COMPREHENSIVE METABOLIC PANEL
ALT: 11 U/L (ref 0–44)
AST: 14 U/L — ABNORMAL LOW (ref 15–41)
Albumin: 3.4 g/dL — ABNORMAL LOW (ref 3.5–5.0)
Alkaline Phosphatase: 34 U/L — ABNORMAL LOW (ref 38–126)
Anion gap: 8 (ref 5–15)
BUN: 9 mg/dL (ref 6–20)
CO2: 26 mmol/L (ref 22–32)
Calcium: 8.8 mg/dL — ABNORMAL LOW (ref 8.9–10.3)
Chloride: 103 mmol/L (ref 98–111)
Creatinine, Ser: 0.72 mg/dL (ref 0.44–1.00)
GFR, Estimated: >60 mL/min (ref >60)
Glucose, Bld: 135 mg/dL — ABNORMAL HIGH (ref 70–99)
Potassium: 4.1 mmol/L (ref 3.5–5.1)
Sodium: 137 mmol/L (ref 135–145)
Total Bilirubin: 0.4 mg/dL (ref 0.3–1.2)
Total Protein: 6.7 g/dL (ref 6.5–8.1)

## 2021-06-13 MED ORDER — ACETAMINOPHEN 500 MG PO TABS
ORAL_TABLET | ORAL | Status: AC
  Filled 2021-06-13: qty 2

## 2021-06-13 MED ORDER — OXYCODONE-ACETAMINOPHEN 5-325 MG PO TABS: 1.0000 | ORAL_TABLET | ORAL | 0 refills | Status: DC | PRN

## 2021-06-13 MED ORDER — OXYCODONE HCL 5 MG PO TABS
ORAL_TABLET | ORAL | Status: AC
Start: 2021-06-13 — End: ?
  Filled 2021-06-13: qty 2

## 2021-06-13 MED ORDER — IBUPROFEN 600 MG PO TABS: 600.0000 mg | ORAL_TABLET | Freq: Four times a day (QID) | ORAL | 1 refills | Status: DC | PRN

## 2021-06-13 MED ORDER — KETOROLAC TROMETHAMINE 30 MG/ML IJ SOLN
INTRAMUSCULAR | Status: AC
  Filled 2021-06-13: qty 1

## 2021-06-13 MED ORDER — OXYCODONE HCL 5 MG PO TABS
ORAL_TABLET | ORAL | Status: AC
  Filled 2021-06-13: qty 2

## 2021-06-13 NOTE — Discharge Instructions (Signed)
Call MD for T>100.4, heavy vaginal bleeding, severe abdominal pain, intractable nausea and/or vomiting, or respiratory distress.  Call office to schedule postop appointment in 2 weeks.  Pelvic rest x 6 weeks.  No driving while taking narcotics. 

## 2021-06-13 NOTE — Progress Notes (Signed)
1 Day Post-Op Procedure(s) (LRB): ?TOTAL ABDOMINAL HYSTERECTOMY, BILATERAL SALPINGECTOMY (Bilateral) ?OPEN SALPINGO OOPHORECTOMY (Bilateral) ? ?Subjective: ?Patient reports tolerating PO and no problems voiding.  Pain well controlled.  Voiding without difficulty.  Ambulating well. ? ?Objective: ?I have reviewed patient's vital signs, intake and output, medications, and labs. ? ?General: alert, cooperative, and appears stated age ?Extremities: extremities normal, atraumatic, no cyanosis or edema ?ABd: soft, ND.  Honeycomb c/d/i ? ?Assessment: ?s/p Procedure(s): ?TOTAL ABDOMINAL HYSTERECTOMY, BILATERAL SALPINGECTOMY (Bilateral) ?OPEN SALPINGO OOPHORECTOMY (Bilateral): stable and progressing well ? ?Plan: ?Discharge home ? LOS: 0 days  ? ? ?Jenny Morales ?06/13/2021, 8:11 AM ? ? ? ? ?

## 2021-06-13 NOTE — Discharge Summary (Signed)
Physician Discharge Summary  ?Patient ID: ?Jenny Morales ?MRN: [id number] ?DOB/AGE: 06-20-1970 51 y.o. ? ?Admit date: 06/12/2021 ?Discharge date: 06/13/2021 ? ?Admission Diagnoses: Symptomatic uterine fibroids and bilateral adnexal masses ? ?Discharge Diagnoses:  ?Principal Problem: ?  S/P abdominal hysterectomy ?Active Problems: ?  Fibroid uterus ? ? ?Discharged Condition: good ? ?Hospital Course: Patient was admitted for anticipate TAH, BS with possible bilateral ovarian cystectomy, possible bilateral oophorectomy.  Her surgery was completed without complication and ultimately, TAH, BSO was performed.  Please see operative note for details.  On postop day 1, patient was meeting all goals.  She was ambulating without difficulty, voiding well, tolerating po, had adequate pain control.  She was discharged home with office f/u in 2 weeks. ? ?Consults: None ? ?Significant Diagnostic Studies: n/a ? ?Treatments: surgery: TAH, BSO ? ?Discharge Exam: ?Blood pressure 123/87, pulse 60, temperature 97.7 ?F (36.5 ?C), resp. rate 16, height 5' 5.5" (1.664 m), weight 110.5 kg, last menstrual period 06/07/2021, SpO2 100 %. ?General appearance: alert and cooperative ?Extremities: extremities normal, atraumatic, no cyanosis or edema ?Incision/Wound: ?Abd: soft, NT.  Honeycomb c/d/i ? ?Disposition: Discharge disposition: 01-Home or Self Care ? ? ? ? ? ? ?Discharge Instructions   ? ? Discharge patient   Complete by: As directed ?  ? Discharge disposition: 01-Home or Self Care  ? Discharge patient date: 06/13/2021  ? ?  ? ?Allergies as of 06/13/2021   ?No Known Allergies ?  ? ?  ?Medication List  ?  ? ?TAKE these medications   ? ?amLODipine 10 MG tablet ?Commonly known as: NORVASC ?Take 1 tablet (10 mg total) by mouth daily. ?  ?cetirizine 10 MG tablet ?Commonly known as: ZYRTEC ?Take 10 mg by mouth as needed for allergies. ?  ?fexofenadine-pseudoephedrine 60-120 MG 12 hr tablet ?Commonly known as: ALLEGRA-D ?Take 1 tablet by mouth 2 (two)  times daily. ?  ?Fish Oil 1000 MG Caps ?Take by mouth. ?  ?Ginkgo Biloba 40 MG Tabs ?Take 1 tablet by mouth daily. ?  ?hydrOXYzine 25 MG tablet ?Commonly known as: ATARAX ?Take 1 tablet (25 mg total) by mouth 3 (three) times daily as needed. ?What changed:  ?when to take this ?reasons to take this ?  ?ibuprofen 600 MG tablet ?Commonly known as: ADVIL ?Take 1 tablet (600 mg total) by mouth every 6 (six) hours as needed. ?What changed:  ?medication strength ?how much to take ?additional instructions ?  ?metroNIDAZOLE 1 % gel ?Commonly known as: Metrogel ?Apply to facial bumps once daily as needed for flares ?  ?oxyCODONE-acetaminophen 5-325 MG tablet ?Commonly known as: Percocet ?Take 1 tablet by mouth every 4 (four) hours as needed for severe pain. ?  ?Vitamin D3 125 MCG (5000 UT) Caps ?Take by mouth. ?  ? ?  ? ? ? ?Signed: ?Jinny Blossom Jatoria Kneeland ?06/13/2021, 8:17 AM ? ? ?

## 2021-06-16 LAB — SURGICAL PATHOLOGY

## 2021-07-03 ENCOUNTER — Encounter: Payer: Self-pay | Admitting: Gynecologic Oncology

## 2021-07-05 ENCOUNTER — Other Ambulatory Visit: Payer: Self-pay

## 2021-07-05 ENCOUNTER — Encounter: Payer: Self-pay | Admitting: Gynecologic Oncology

## 2021-07-05 ENCOUNTER — Inpatient Hospital Stay: Payer: BC Managed Care – PPO

## 2021-07-05 ENCOUNTER — Inpatient Hospital Stay: Payer: BC Managed Care – PPO | Attending: Gynecologic Oncology | Admitting: Gynecologic Oncology

## 2021-07-05 VITALS — BP 140/73 | HR 104 | Temp 98.4°F | Resp 18 | Ht 65.5 in | Wt 240.6 lb

## 2021-07-05 DIAGNOSIS — Z86018 Personal history of other benign neoplasm: Secondary | ICD-10-CM | POA: Insufficient documentation

## 2021-07-05 DIAGNOSIS — Z6839 Body mass index (BMI) 39.0-39.9, adult: Secondary | ICD-10-CM | POA: Insufficient documentation

## 2021-07-05 DIAGNOSIS — R011 Cardiac murmur, unspecified: Secondary | ICD-10-CM | POA: Insufficient documentation

## 2021-07-05 DIAGNOSIS — E669 Obesity, unspecified: Secondary | ICD-10-CM | POA: Diagnosis not present

## 2021-07-05 DIAGNOSIS — F411 Generalized anxiety disorder: Secondary | ICD-10-CM | POA: Diagnosis not present

## 2021-07-05 DIAGNOSIS — M1990 Unspecified osteoarthritis, unspecified site: Secondary | ICD-10-CM | POA: Insufficient documentation

## 2021-07-05 DIAGNOSIS — Z90710 Acquired absence of both cervix and uterus: Secondary | ICD-10-CM | POA: Diagnosis not present

## 2021-07-05 DIAGNOSIS — I10 Essential (primary) hypertension: Secondary | ICD-10-CM | POA: Diagnosis not present

## 2021-07-05 DIAGNOSIS — D3912 Neoplasm of uncertain behavior of left ovary: Secondary | ICD-10-CM | POA: Insufficient documentation

## 2021-07-05 DIAGNOSIS — Z90722 Acquired absence of ovaries, bilateral: Secondary | ICD-10-CM | POA: Insufficient documentation

## 2021-07-05 DIAGNOSIS — Z79899 Other long term (current) drug therapy: Secondary | ICD-10-CM | POA: Insufficient documentation

## 2021-07-05 DIAGNOSIS — D219 Benign neoplasm of connective and other soft tissue, unspecified: Secondary | ICD-10-CM

## 2021-07-05 LAB — CEA (IN HOUSE-CHCC): CEA (CHCC-In House): 2.09 ng/mL (ref 0.00–5.00)

## 2021-07-05 NOTE — Patient Instructions (Signed)
It was a pleasure to meet you today. ? ?I will add a comment to your blood test once it is back as well as your CT scan.  If we need to discuss any findings on your CT scan, I will call you. ? 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  Based on what was removed at surgery, the stage of this borderline tumor was stage I (early stage, meaning it had not spread outside of the ovary).  Borderline tumors fall somewhere between benign tumors and cancers.  While they do not require other treatment after surgery, such as chemotherapy or radiation, there is a risk of recurrence and so longer-term follow-up is recommended.  There is not great agreement on how closely you should be followed, so I typically offer patients follow-up anywhere from every 3 months to yearly initially.  Ultimately, once you are further out from surgery, transitioning to visits every 6-12 months is very reasonable.  Based on our discussion today, I think visits every 6 months initially sounds like a good plan and when that will make you feel more comfortable. ? ?We also discussed the role of laboratory testing at the time of your follow-up visits.  Because CA-125 not appear to be a tumor marker for you, I do not think that it is helpful to get this at future visits.  Mucinous borderline tumors and mucinous cancers can sometimes have an increased CEA.  While we will not know if this was increased before surgery, it can be a blood test use to follow you in the future. ? ?We also discussed signs and symptoms that represent recurrence.  If you were to have any of these between visits with your OB/GYN, please call to get a visit scheduled sooner. ?

## 2021-07-05 NOTE — Progress Notes (Signed)
GYNECOLOGIC ONCOLOGY NEW PATIENT CONSULTATION  ? ?Patient Name: Jenny Morales  ?Patient Age: 51 y.o. ?Date of Service: 07/05/21 ?Referring Provider: Linda Hedges, DO ? ?Primary Care Provider: Martyn Malay, MD ?Consulting Provider: Jeral Pinch, MD  ? 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   ? ?The patient is doing very well from a postoperative milestones. She has had follow-up with her OBGYN already and returns for another postoperative visit soon. Pelvic exam was deferred today. ? ?We discussed in detail the diagnosis of a borderline ovarian tumor. Although it lacks invasion, the hallmark of cancer, borderline tumors have a risk of recurrence (and a small risk of recurrence as invasive carcinoma) and long-term surveillance is recommended. Mucinous BOTs, which are the most common type of borderline tumors after serous, are very frequently unilateral and diagnosed at an early stage (stage I). While the data would support no survival benefit to staging in the setting of borderline tumors, if diagnosis is know intra-op, staging (or modified staging) may be performed. I recommend that we obtain a CT scan to evaluate for any unseen evidence (from surgery) of metastatic disease. If negative, I do not think that there is therapeutic value in returning to the OR for additional biopsies.  ? ?In terms of surveillance, we discussed that there is significant variation in terms of what guidelines exist. The NCCN describes that patients with BOTs can be followed similarly to patients with invasive ovarian cancer. SGO guidelines support that after completion surgery (BSO), that yearly surveillance is likely adequate. After a discussion with the patient, she voiced being most comfortable with more frequent screening initially (every 6 months) and then transitioning to yearly screening at some  time. We discussed signs and symptoms that could signify recurrence of her BOT and I stressed the importance of calling to be seen if she has these symptoms. Her surveillance visits should include a review of symptoms and pelvic exam.  ? ?CA-125 does not appear to have been a marker for her, and I would not suggest following this in the future. Mucinous ovarian tumors can sometimes be associated with increased CEA levels. Unfortunately, since we don't have a CEA level prior to her surgery, we don't know if this is a tumor marker for her. I think it would be reasonable to get this lab test at her surveillance visits. The patient was interested in this; we will plan to get a CEA today to establish a baseline post surgery. ? ?I offered that surveillance could be with her gynecologist oncology or alternating initially between our clinics. Her preference is to see her GYN for follow-up.  ? ?A copy of this note was sent to the patient's referring provider.  ? ?55 minutes of total time was spent for this patient encounter, including preparation, face-to-face counseling with the patient and coordination of care, and documentation of the encounter. ? ? ?Jenny Pinch, MD  ?Division of Gynecologic Oncology  ?Department of Obstetrics and Gynecology  ?[facility]  ?___________________________________________  ?Chief Complaint: ?No chief complaint on file. ? ? ?History of Present Illness:  ?Jenny Morales is a 51 y.o. y.o. female who is seen in consultation at the request of [name] for an evaluation of borderline ovarian tumor. ? ?In the setting of a long history of abnormal uterine bleeding as well as ultrasound findings of multiple uterine fibroids and bilateral ovarian cysts,  the patient was counseled on treatment options and ultimately decision was made to proceed with definitive surgery.  Ultrasound exam prior to surgery showed fibroids measuring up to 8.2 cm.  Right ovary with a 4.5 x  3.2 cyst containing diffuse echoes.  Left ovary with multicystic mass and some diffuse echoes, cystic components measuring 2.9, 4.6, and 3.9 cm. CA-125 was obtained on 03/15/2021 and normal at 28.7. ? ?Patient underwent total abdominal hysterectomy with bilateral salpingo-oophorectomy on 4/3.  Findings at the time of surgery included an 18-week size uterus with normal-appearing bilateral tubes.  Left ovary enlarged with no clearly identifiable normal ovarian stroma, measuring 8 cm.  Right ovary described as being densely adherent to the right pelvic sidewall and posterior cul-de-sac.  Left ovary was removed intact.  Right ovary with cyst spillage during removal secondary to adherence to surrounding structures.  Chocolate appearance of cyst fluid described. ? ?Final pathology from surgery showed right hemorrhagic ovarian cyst suggestive of endometriotic cyst.  Left ovary with a 10.3 cm intact borderline mucinous tumor of the ovary, no evidence of invasion or surface involvement noted.  Bilateral fallopian tubes were normal in appearance.  Uterus with multiple fibroids measuring up to 10.3 cm.  Benign and inactive endometrium, benign cervix. ? ?Patient reports doing well postoperatively.  She denies any significant pain, uses ibuprofen only as needed now.  Endorses normal bowel function.  Has occasional pressure when her bladder gets full, otherwise denies urinary symptoms.  Endorses a good appetite without nausea or emesis. ? ?Presents with her spouse today. ? ?PAST MEDICAL HISTORY:  ?Past Medical History:  ?Diagnosis Date  ? Abnormal uterine bleeding (AUB) 2022  ? heavy menstrual bleeding  ? Anxiety   ? generalized anxiety disorder with a history of panic attatcks  ? Arthritis 05/30/2021  ? knees  ? Fibroids   ? Heart murmur   ? Per pt, heart mumrmur was from having rheumatic fever as a child. Pt was on PCN for years. In 35 's, pt stated that she no longer had a heart murmur and was taken off the PCN.  ? History of  palpitations 11/30/2016  ? normal EKG, MD note in Epic  states suspected underlying anxiety  ? Hypertension 01/11/2021  ? Insomnia 2022  ? Obesity   ? Subconjunctival hematoma, left 04/03/2011  ? No current deficit  ? Wears glasses   ?  ? ?PAST SURGICAL HISTORY:  ?Past Surgical History:  ?Procedure Laterality Date  ? COLONOSCOPY  02/13/2021  ? colon polyps  ? HYSTERECTOMY ABDOMINAL WITH SALPINGECTOMY Bilateral 06/12/2021  ? Procedure: TOTAL ABDOMINAL HYSTERECTOMY, BILATERAL SALPINGECTOMY;  Surgeon: Jenny Hedges, DO;  Location: [facility];  Service: Gynecology;  Laterality: Bilateral;  ? POLYPECTOMY    ? SALPINGOOPHORECTOMY Bilateral 06/12/2021  ? Procedure: OPEN SALPINGO OOPHORECTOMY;  Surgeon: Jenny Hedges, DO;  Location: [facility];  Service: Gynecology;  Laterality: Bilateral;  ? TONSILLECTOMY AND ADENOIDECTOMY    ? When pt was around 51 years old.  ? ? ?OB/GYN HISTORY:  ?OB History  ?Gravida Para Term Preterm AB Living  ?0 0 0 0 0 0  ?SAB IAB Ectopic Multiple Live Births  ?0 0 0 0 0  ? ? ?Patient's last menstrual period was 06/07/2021 (exact date). ? ?Age at menarche: 67 ?Age at menopause: 58 ?Hx of HRT: Denies ?Hx of STDs: Denies ?Last pap: 12/2020 ?History of abnormal pap smears: Denies ? ?SCREENING STUDIES:  ?Last mammogram: 2022  ?Last colonoscopy: 2022 ? ?MEDICATIONS: ?Outpatient Encounter  Medications as of 07/05/2021  ?Medication Sig  ? amLODipine (NORVASC) 10 MG tablet Take 1 tablet (10 mg total) by mouth daily.  ? Cholecalciferol (VITAMIN D3) 125 MCG (5000 UT) CAPS Take by mouth.  ? fexofenadine-pseudoephedrine (ALLEGRA-D) 60-120 MG 12 hr tablet Take 1 tablet by mouth 2 (two) times daily.  ? Ginkgo Biloba 40 MG TABS Take 1 tablet by mouth daily.  ? hydrOXYzine (ATARAX/VISTARIL) 25 MG tablet Take 1 tablet (25 mg total) by mouth 3 (three) times daily as needed. (Patient taking differently: Take 25 mg by mouth at bedtime as needed for anxiety.)  ? ibuprofen (ADVIL) 600 MG tablet  Take 1 tablet (600 mg total) by mouth every 6 (six) hours as needed.  ? metroNIDAZOLE (METROGEL) 1 % gel Apply to facial bumps once daily as needed for flares  ? Omega-3 Fatty Acids (FISH OIL) 1000 MG

## 2021-07-06 ENCOUNTER — Telehealth: Payer: Self-pay | Admitting: *Deleted

## 2021-07-06 NOTE — Telephone Encounter (Signed)
Spoke with pt to inform her that her CEA is 2.09. Pt verbalized understanding and did not voice any concerns at this time.  ?

## 2021-07-07 ENCOUNTER — Telehealth: Payer: Self-pay

## 2021-07-07 NOTE — Telephone Encounter (Signed)
Reviewed CEA lab results with patient (2.09). Patient verbalized understanding and states " Thank you so much for calling, someone also reached out yesterday but I appreciate the call." ?Instructed to call with any needs.  ?

## 2021-07-07 NOTE — Telephone Encounter (Signed)
LVM for patient to call back regarding lab results.  ?

## 2021-07-17 ENCOUNTER — Ambulatory Visit (HOSPITAL_COMMUNITY): Payer: BC Managed Care – PPO

## 2021-07-24 ENCOUNTER — Telehealth: Payer: Self-pay | Admitting: *Deleted

## 2021-07-24 NOTE — Telephone Encounter (Signed)
Spoke with pt regarding rescheduling her CT scan. Pt stated that she's going to wait to have the scan because she just had a exam with Dr.Tucker and everything looks ok and her insurance is only paying a certain amount for it. Pt wants to wait to have it done at a later time. Joylene John, NP notified.  ?

## 2021-08-04 ENCOUNTER — Encounter: Payer: Self-pay | Admitting: Gynecologic Oncology

## 2021-08-15 ENCOUNTER — Encounter: Payer: Self-pay | Admitting: *Deleted

## 2021-10-27 ENCOUNTER — Ambulatory Visit: Payer: 59 | Admitting: Family Medicine

## 2021-10-27 ENCOUNTER — Encounter: Payer: Self-pay | Admitting: Family Medicine

## 2021-10-27 ENCOUNTER — Other Ambulatory Visit: Payer: Self-pay

## 2021-10-27 VITALS — BP 132/78 | HR 87 | Wt 254.6 lb

## 2021-10-27 DIAGNOSIS — Z23 Encounter for immunization: Secondary | ICD-10-CM

## 2021-10-27 DIAGNOSIS — D3912 Neoplasm of uncertain behavior of left ovary: Secondary | ICD-10-CM

## 2021-10-27 DIAGNOSIS — R030 Elevated blood-pressure reading, without diagnosis of hypertension: Secondary | ICD-10-CM

## 2021-10-27 DIAGNOSIS — I10 Essential (primary) hypertension: Secondary | ICD-10-CM | POA: Diagnosis not present

## 2021-10-27 DIAGNOSIS — F5101 Primary insomnia: Secondary | ICD-10-CM | POA: Diagnosis not present

## 2021-10-27 MED ORDER — TETANUS-DIPHTH-ACELL PERTUSSIS 5-2.5-18.5 LF-MCG/0.5 IM SUSY: 0.5000 mL | PREFILLED_SYRINGE | Freq: Once | INTRAMUSCULAR | 0 refills | Status: AC

## 2021-10-27 MED ORDER — DICLOFENAC SODIUM 1 % EX CREA: TOPICAL_CREAM | CUTANEOUS | 3 refills | Status: DC

## 2021-10-27 MED ORDER — AMLODIPINE BESYLATE 10 MG PO TABS: 10.0000 mg | ORAL_TABLET | Freq: Every day | ORAL | 3 refills | Status: DC

## 2021-10-27 MED ORDER — HYDROXYZINE HCL 25 MG PO TABS: 25.0000 mg | ORAL_TABLET | Freq: Every evening | ORAL | 3 refills | Status: DC | PRN

## 2021-10-27 NOTE — Patient Instructions (Signed)
It was wonderful to see you today.  Please bring ALL of your medications with you to every visit.   Today we talked about:  --Going to check your blood work  - You can call [phone] if you want to schedule your CT scan  - I sent your Tdap to your pharmacy   - I sent in a gel for your knees---try to use this instead of your ibuprofen--it is okay to use ibuprofen a few times  weeks   Please follow up in 6 months   Thank you for choosing [facility].   Please call [phone] with any questions about today's appointment.  Please be sure to schedule follow up at the front  desk before you leave today.   Dorris Singh, MD  Family Medicine

## 2021-10-27 NOTE — Assessment & Plan Note (Signed)
At goal, continue current therapy. BMP and lipids today.

## 2021-10-27 NOTE — Progress Notes (Signed)
    SUBJECTIVE:   CHIEF COMPLAINT: BP check  HPI:   Jenny Morales is a 51 y.o.  with history notable for hypertension and borderline ovarian tumor presenting for follow up.  She has no particular concerns today. She is back at work after her hysterectomy. Still works in [employer]. Going to beach later this fall.   HTN: Compliant with medications. No chest pain, headaches, vision changes. Has noticed some post-surgical weight gain she is trying to lose.    Knee Pain Has a history of bilateral early OA. Taking ibuprofen 600 mg once per day. No stomach upset. This works well for pain and keeps her active.   Takes Atarax to sleep most nights. This helps and she denies side effects.   PERTINENT  PMH / PSH/Family/Social History : Updated and reviewed  OBJECTIVE:   BP 132/78   Pulse 87   Wt 254 lb 9.6 oz (115.5 kg)   SpO2 99%   BMI 41.72 kg/m   Today's weight:  Last Weight  Most recent update: 10/27/2021  9:55 AM    Weight  115.5 kg (254 lb 9.6 oz)            Review of prior weights: [employer]   10/27/21 0954  Weight: 254 lb 9.6 oz (115.5 kg)   Cardiac: Regular rate and rhythm. Normal S1/S2. No murmurs, rubs, or gallops appreciated. Lungs: Clear bilaterally to ascultation.  Abdomen: Normoactive bowel sounds. No tenderness to deep or light palpation. No rebound or guarding.    Psych: Pleasant and appropriate    ASSESSMENT/PLAN:   Ovarian tumor of borderline malignancy, left Discussed CT--patient undecided. Number for radiology provided in case she wishes to schedule.   Hypertension At goal, continue current therapy. BMP and lipids today.     Bilateral knee pain, had OA 10 year ago on imaging - Voltaren gel, discussed limiting oral NSAIDs - Consider repeat imaging at follow up   HCM UTD on shingles Rx for Tdap     Dorris Singh, MD  [facility]

## 2021-10-27 NOTE — Assessment & Plan Note (Signed)
Discussed CT--patient undecided. Number for radiology provided in case she wishes to schedule.

## 2021-10-28 LAB — COMPREHENSIVE METABOLIC PANEL
ALT: 17 IU/L (ref 0–32)
AST: 18 IU/L (ref 0–40)
Albumin/Globulin Ratio: 1.2 (ref 1.2–2.2)
Albumin: 4.3 g/dL (ref 3.8–4.9)
Alkaline Phosphatase: 63 IU/L (ref 44–121)
BUN/Creatinine Ratio: 24 — ABNORMAL HIGH (ref 9–23)
BUN: 18 mg/dL (ref 6–24)
Bilirubin Total: 0.3 mg/dL (ref 0.0–1.2)
CO2: 23 mmol/L (ref 20–29)
Calcium: 10.1 mg/dL (ref 8.7–10.2)
Chloride: 102 mmol/L (ref 96–106)
Creatinine, Ser: 0.74 mg/dL (ref 0.57–1.00)
Globulin, Total: 3.5 g/dL (ref 1.5–4.5)
Glucose: 107 mg/dL — ABNORMAL HIGH (ref 70–99)
Potassium: 4.5 mmol/L (ref 3.5–5.2)
Sodium: 140 mmol/L (ref 134–144)
Total Protein: 7.8 g/dL (ref 6.0–8.5)
eGFR: 98 mL/min/{1.73_m2} (ref >59)

## 2021-10-28 LAB — LIPID PANEL
Chol/HDL Ratio: 3.8 ratio (ref 0.0–4.4)
Cholesterol, Total: 229 mg/dL — ABNORMAL HIGH (ref 100–199)
HDL: 61 mg/dL (ref >39)
LDL Chol Calc (NIH): 145 mg/dL — ABNORMAL HIGH (ref 0–99)
Triglycerides: 129 mg/dL (ref 0–149)
VLDL Cholesterol Cal: 23 mg/dL (ref 5–40)

## 2021-10-28 LAB — CBC
Hematocrit: 41 % (ref 34.0–46.6)
Hemoglobin: 13.4 g/dL (ref 11.1–15.9)
MCH: 25.7 pg — ABNORMAL LOW (ref 26.6–33.0)
MCHC: 32.7 g/dL (ref 31.5–35.7)
MCV: 79 fL (ref 79–97)
Platelets: 339 10*3/uL (ref 150–450)
RBC: 5.22 x10E6/uL (ref 3.77–5.28)
RDW: 13.5 % (ref 11.7–15.4)
WBC: 6.9 10*3/uL (ref 3.4–10.8)

## 2022-10-15 ENCOUNTER — Other Ambulatory Visit: Payer: Self-pay

## 2022-10-15 DIAGNOSIS — F5101 Primary insomnia: Secondary | ICD-10-CM

## 2022-10-15 MED ORDER — HYDROXYZINE HCL 25 MG PO TABS: 25.0000 mg | ORAL_TABLET | Freq: Every evening | ORAL | 3 refills | Status: AC | PRN

## 2022-10-18 NOTE — Progress Notes (Signed)
    SUBJECTIVE:   Chief compliant/HPI: annual examination  Jenny Morales is a 52 y.o. who presents today for an annual exam.   HTN She reports compliance with amlodipine. No side effects.   Weight gain Has noted slow weight gain. No signs or symptoms of thyroid disease. Continues to engage in cardio 150 minutes per week. Tries to eat healthy. Believes this is related to hormone changes of menopause.  History tabs reviewed and updated .   Review of systems form reviewed and notable for none.   OBJECTIVE:   BP 130/84   Pulse 75   Temp 98 F (36.7 C) (Oral)   Ht 5' 5.5" (1.664 m)   Wt 267 lb 9.6 oz (121.4 kg)   SpO2 100%   BMI 43.85 kg/m   HEENT: EOMI. Sclera without injection or icterus. MMM. External auditory canal examined and WNL. TM normal appearance, no erythema or bulging. Neck: Supple.  Cardiac: Regular rate and rhythm. Normal S1/S2. No murmurs, rubs, or gallops appreciated. Lungs: Clear bilaterally to ascultation.  Abdomen: Normoactive bowel sounds. No tenderness to deep or light palpation. No rebound or guarding.    Neuro: Normal speech Ext: Trace edema   Psych: Pleasant and appropriate    ASSESSMENT/PLAN:   OBESITY Discussed nutrition, medication, surgery Will implement lifestyle changes She will message about nutrition referral Rx Wegovy follow up 4 weeks for virtual visit   Ovarian tumor of borderline malignancy, left Follows with Gyn for q57m CEA and exams    Hypertension At goal, labs today  Refilled amlodipine     Annual Examination  See AVS for age appropriate recommendations.    Considered the following items based upon USPSTF recommendations: Diabetes screening: ordered Screening for elevated cholesterol: ordered   Follows with Gyn for mammogram  Colorectal cancer screening: up to date on screening for CRC. if age 74 or over.   Follow up in 4 weeks for [facility] or sooner if indicated.    Westley Chandler, MD [facility]

## 2022-10-19 ENCOUNTER — Encounter: Payer: Self-pay | Admitting: Family Medicine

## 2022-10-19 ENCOUNTER — Ambulatory Visit (INDEPENDENT_AMBULATORY_CARE_PROVIDER_SITE_OTHER): Payer: 59 | Admitting: Family Medicine

## 2022-10-19 VITALS — BP 130/84 | HR 75 | Temp 98.0°F | Ht 65.5 in | Wt 267.6 lb

## 2022-10-19 DIAGNOSIS — E559 Vitamin D deficiency, unspecified: Secondary | ICD-10-CM | POA: Diagnosis not present

## 2022-10-19 DIAGNOSIS — I10 Essential (primary) hypertension: Secondary | ICD-10-CM

## 2022-10-19 DIAGNOSIS — Z0000 Encounter for general adult medical examination without abnormal findings: Secondary | ICD-10-CM | POA: Diagnosis not present

## 2022-10-19 DIAGNOSIS — R030 Elevated blood-pressure reading, without diagnosis of hypertension: Secondary | ICD-10-CM | POA: Diagnosis not present

## 2022-10-19 DIAGNOSIS — E669 Obesity, unspecified: Secondary | ICD-10-CM

## 2022-10-19 DIAGNOSIS — D3912 Neoplasm of uncertain behavior of left ovary: Secondary | ICD-10-CM

## 2022-10-19 MED ORDER — WEGOVY 0.25 MG/0.5ML ~~LOC~~ SOAJ: 0.2500 mg | SUBCUTANEOUS | 1 refills | Status: DC

## 2022-10-19 MED ORDER — AMLODIPINE BESYLATE 10 MG PO TABS: 10.0000 mg | ORAL_TABLET | Freq: Every day | ORAL | 3 refills | Status: DC

## 2022-10-19 NOTE — Assessment & Plan Note (Signed)
Discussed nutrition, medication, surgery Will implement lifestyle changes She will message about nutrition referral Rx Wegovy follow up 4 weeks for virtual visit

## 2022-10-19 NOTE — Assessment & Plan Note (Signed)
Follows with Gyn for q7m CEA and exams

## 2022-10-19 NOTE — Assessment & Plan Note (Signed)
At goal, labs today  Refilled amlodipine

## 2022-10-19 NOTE — Patient Instructions (Addendum)
It was wonderful to see you today.  Please bring ALL of your medications with you to every visit.   Today we talked about: Have Fun in [facility]   Starting [place]  I sent the prescription to your pharmacy   We will check blood work today   Please let me know if you have questions. I will send you a letter or call you with results.    Please follow up in 4 weeks for virtual visit   Thank you for choosing [facility].   Please call [phone] with any questions about today's appointment.  Please be sure to schedule follow up at the front  desk before you leave today.   Terisa Starr, MD  Family Medicine

## 2022-10-23 ENCOUNTER — Encounter: Payer: Self-pay | Admitting: Family Medicine

## 2022-11-01 ENCOUNTER — Other Ambulatory Visit (HOSPITAL_COMMUNITY): Payer: Self-pay

## 2022-11-10 IMAGING — MG DIGITAL DIAGNOSTIC BILAT W/ TOMO W/ CAD
6 of 10 series · 6 of 30 positions shown · non-contrast
Comparison: Previous exam(s).

CLINICAL DATA: Possible mass in the upper-outer right breast and
possible mass in the upper outer left breast on a recent screening
mammogram without comparisons.



[R MLO synth-2D]
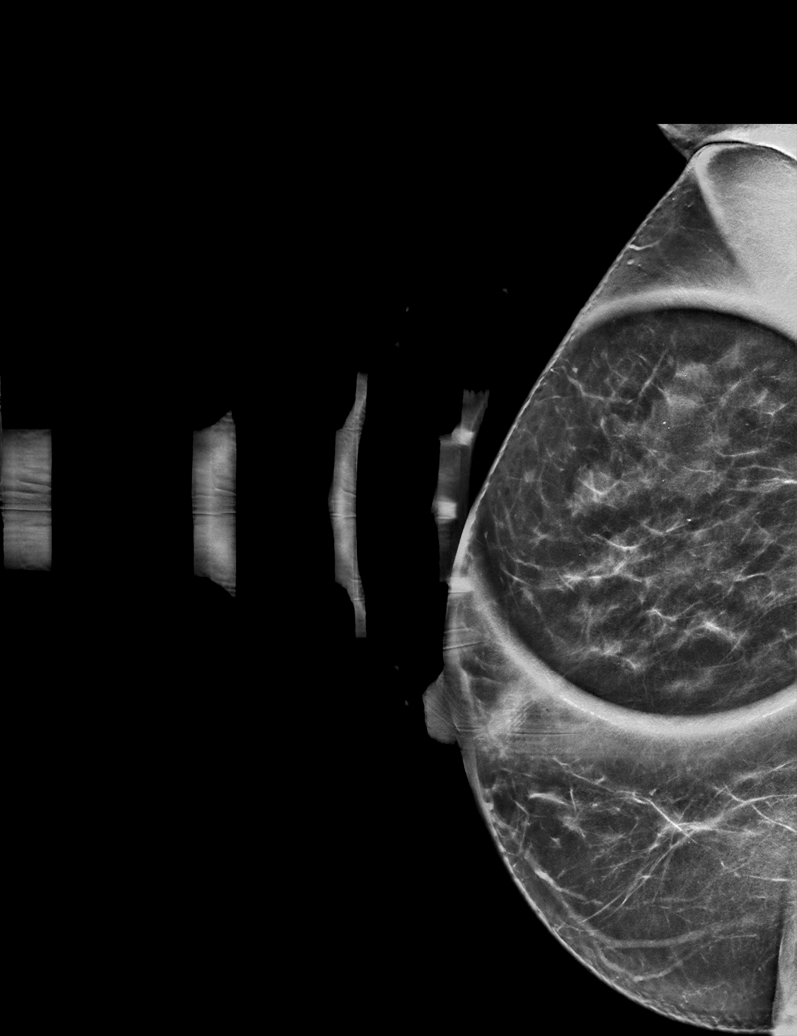

[L MLO synth-2D (1 of 2)]
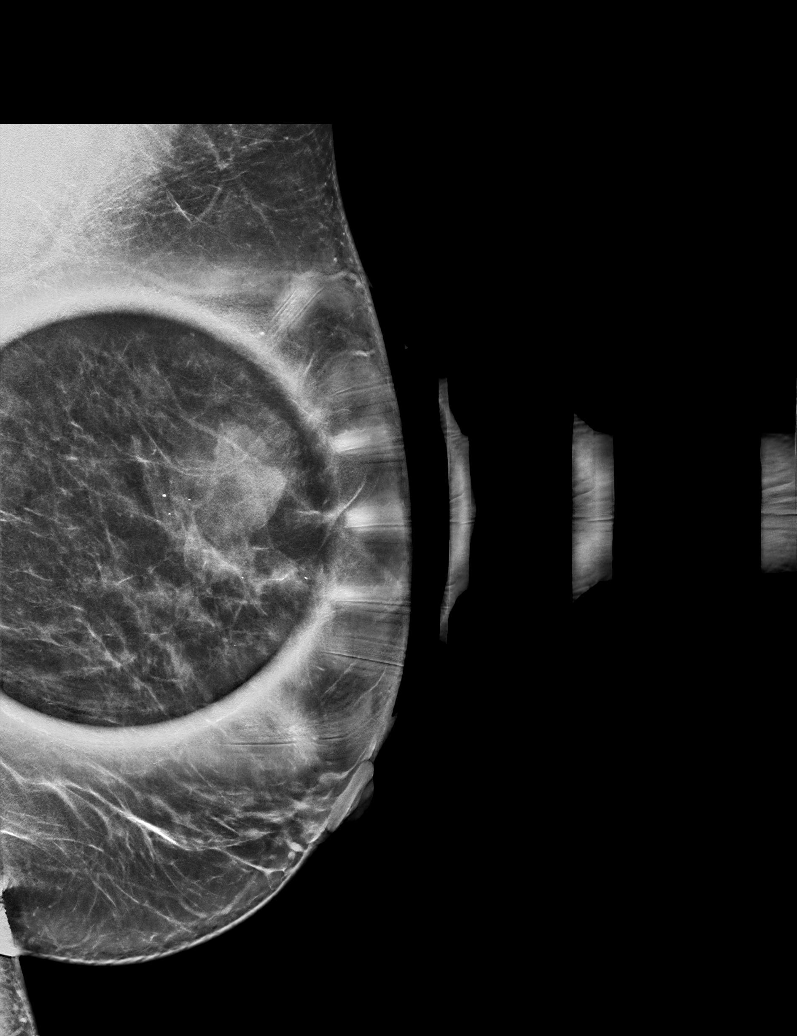

[L CC synth-2D]
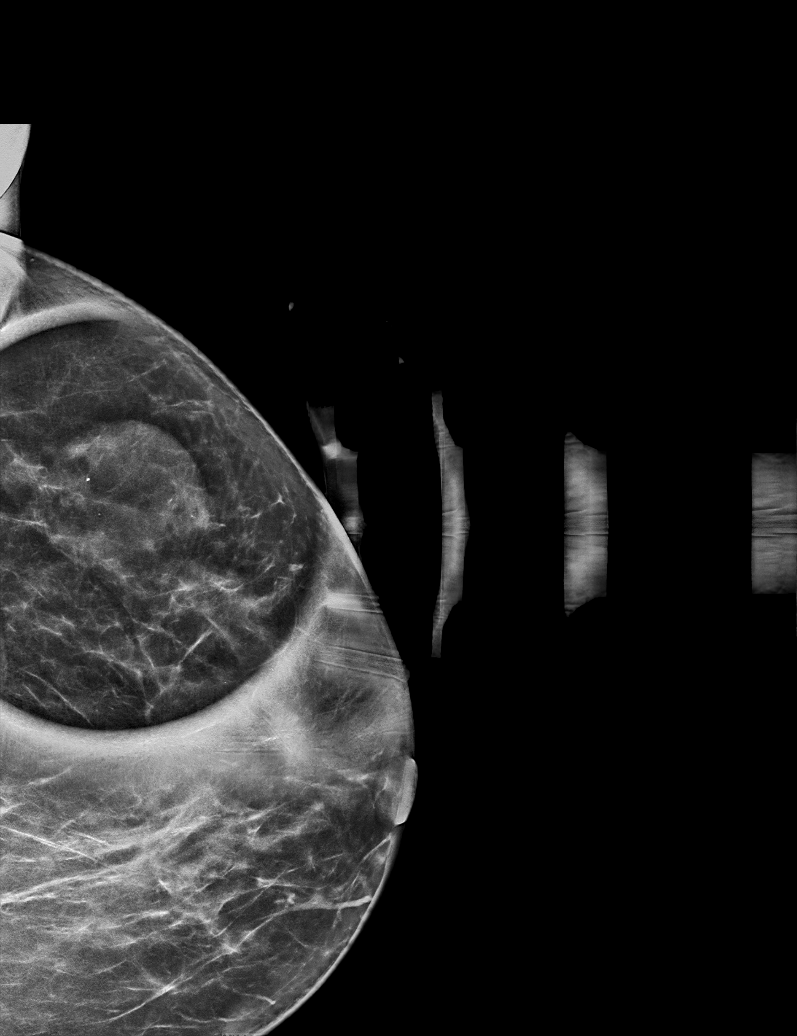

[R CC synth-2D]
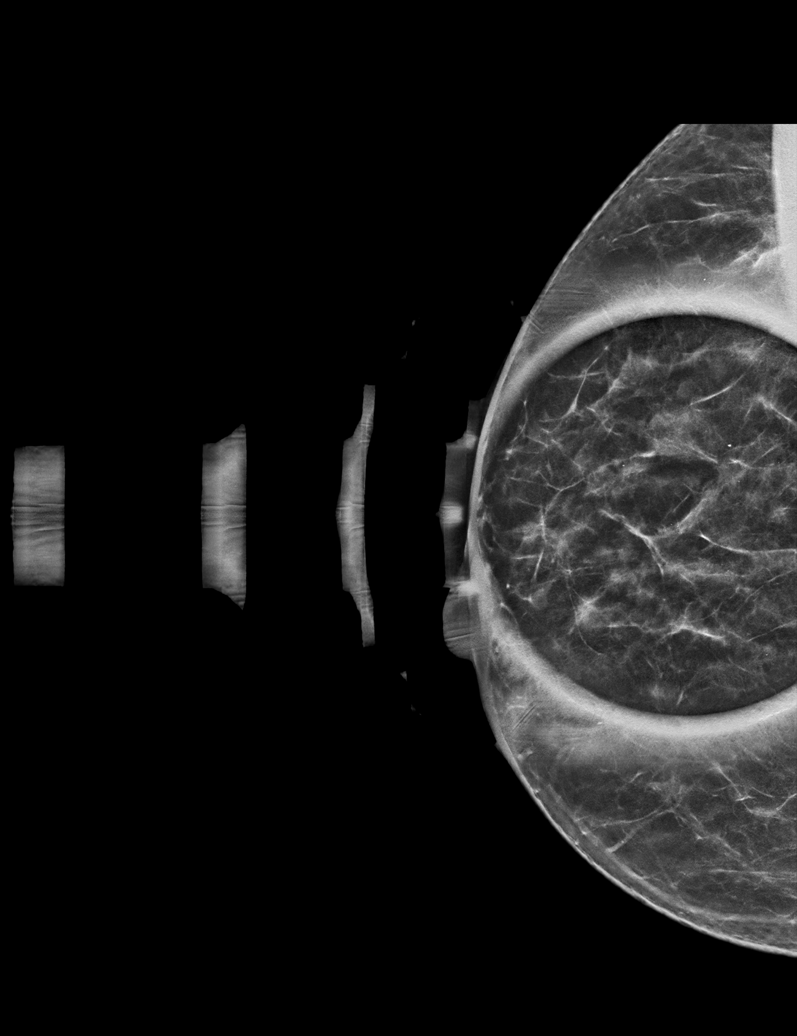

[L MLO synth-2D (2 of 2)]
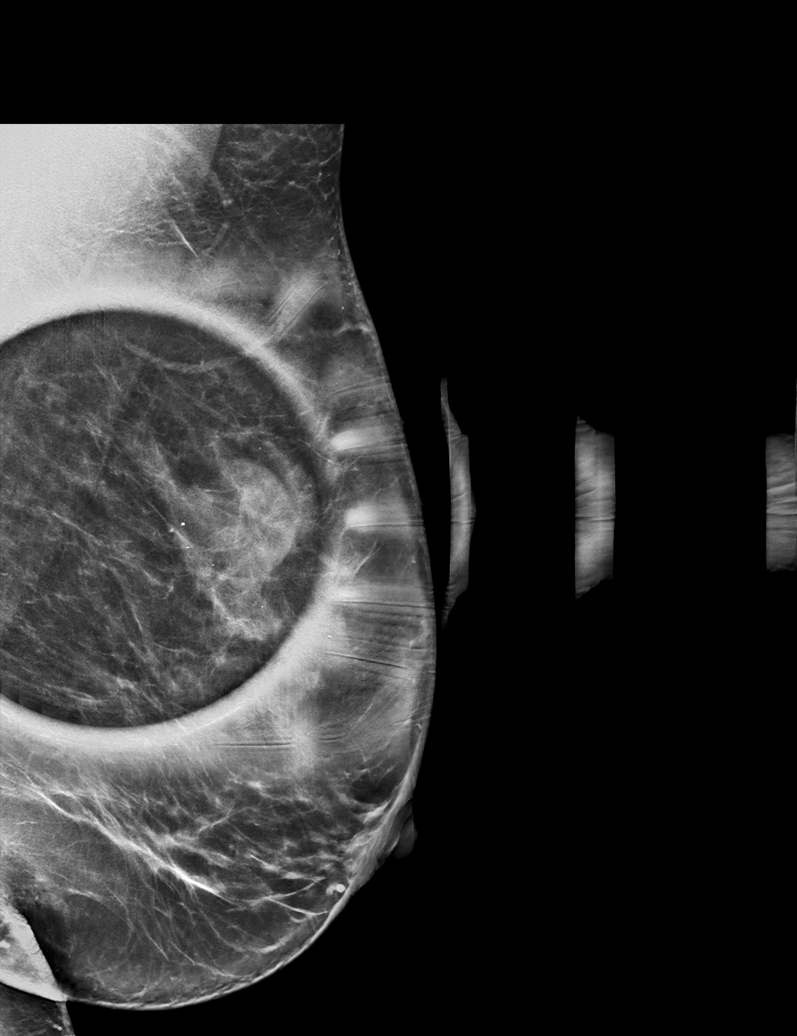

[R MLO tomo · tomo slice 31/60.0]
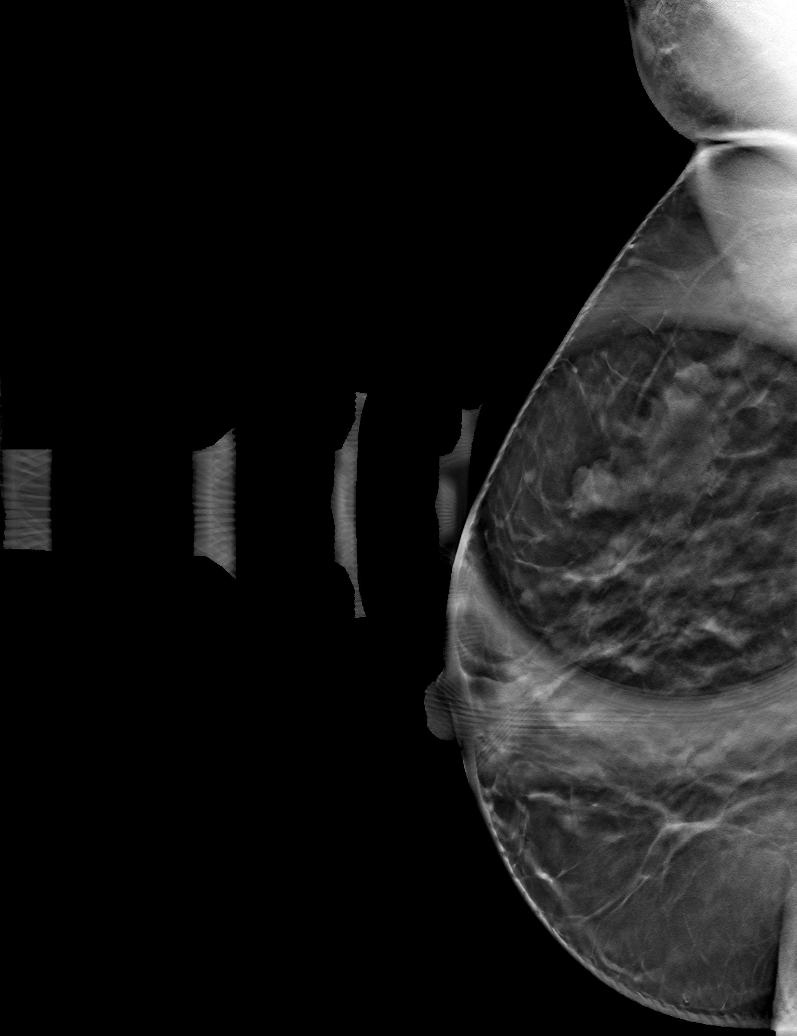

[6 of 30 positions shown; findings below may reference images not displayed]

ACR Breast Density Category c: The breast tissue is heterogeneously
dense, which may obscure small masses.
FINDINGS: 3D tomographic and 2D generated spot compression images of the left
breast confirm an oval, circumscribed mass in the upper-outer
quadrant of each breast.

Targeted ultrasound is performed, showing a 1.6 x 1.2 x 0.9 cm
cluster of cysts in the 10 o'clock position of the right breast, 3
cm from the nipple, corresponding to the mammographic mass on the
right. No internal blood flow with power Doppler.

A 0.0 x 2.4 x 1.3 cm cyst containing a thin internal septation is
demonstrated in the 1:30 o'clock position of the left breast, 7 cm
from the nipple. This corresponds to the mammographic mass on the
left. No internal blood flow with power Doppler. There is an
adjacent smaller, similar-appearing cyst.
IMPRESSION: Bilateral benign breast cysts.  No evidence of malignancy.

RECOMMENDATION:
Bilateral screening mammogram in January 2022 when due.

I have discussed the findings and recommendations with the patient.
If applicable, a reminder letter will be sent to the patient
regarding the next appointment.

BI-RADS CATEGORY  2: Benign.

## 2022-11-10 IMAGING — US US BREAST*R* LIMITED INC AXILLA
1 series · 5 of 5 positions shown · non-contrast
Comparison: Previous exam(s).

CLINICAL DATA: Possible mass in the upper-outer right breast and
possible mass in the upper outer left breast on a recent screening
mammogram without comparisons.



[Series 1: us breast*right* limited inc axilla · 0.07mm/px · 5 of 5 slices shown]
[im 1/5]
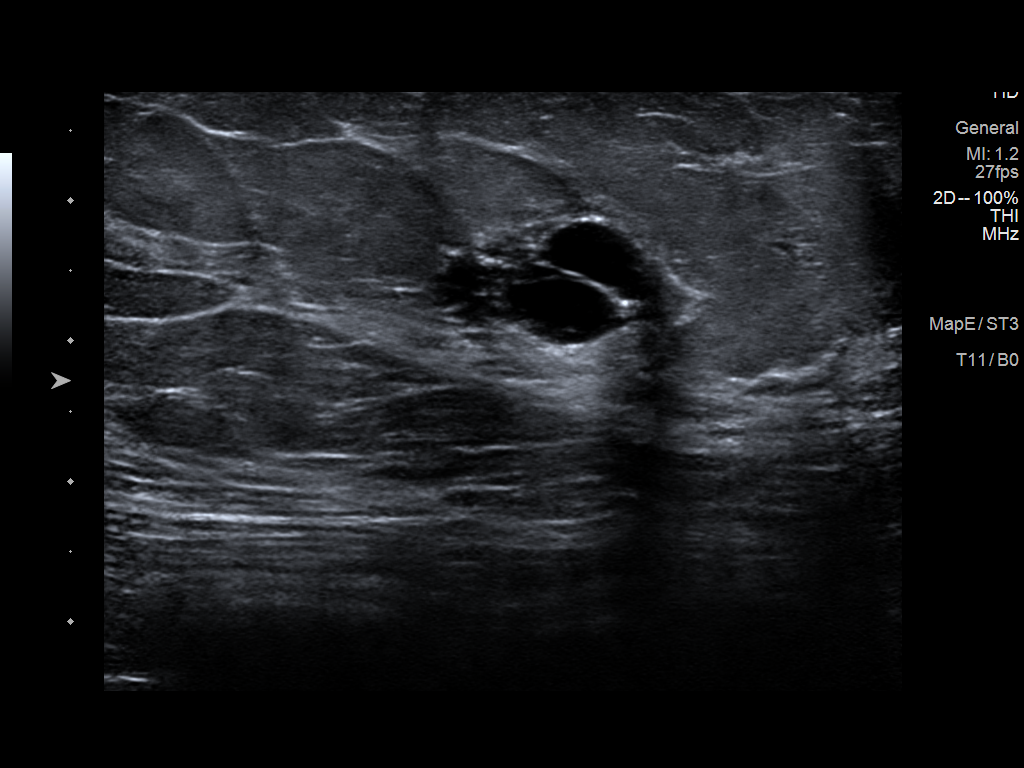
[im 2/5]
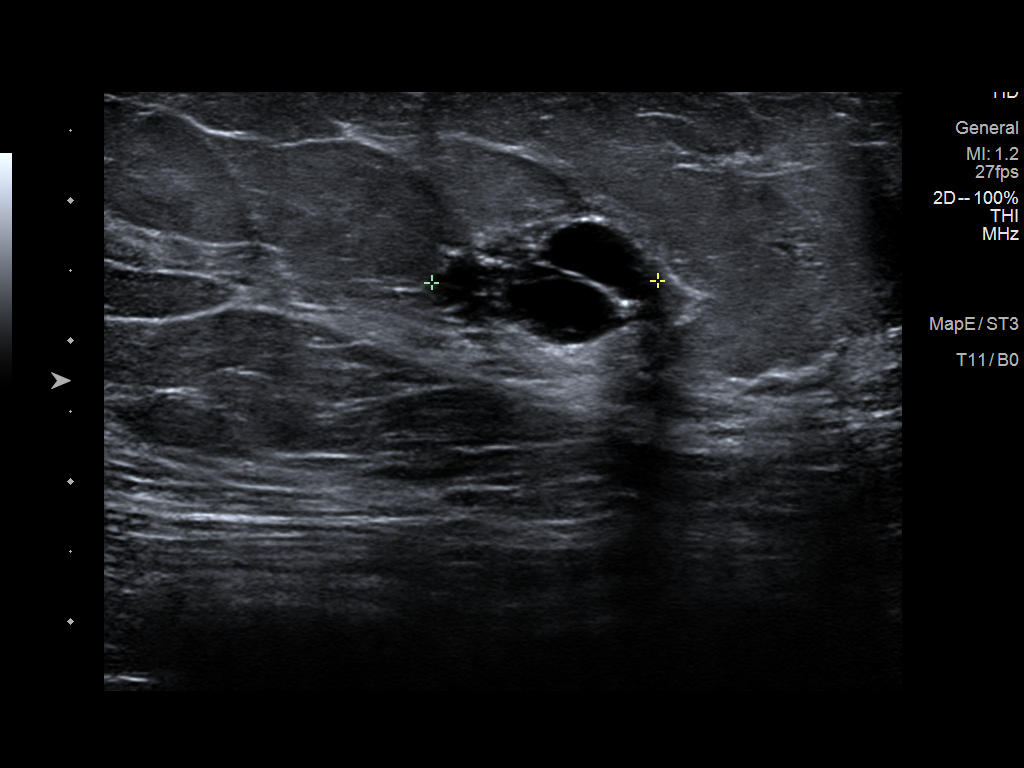
[im 3/5]
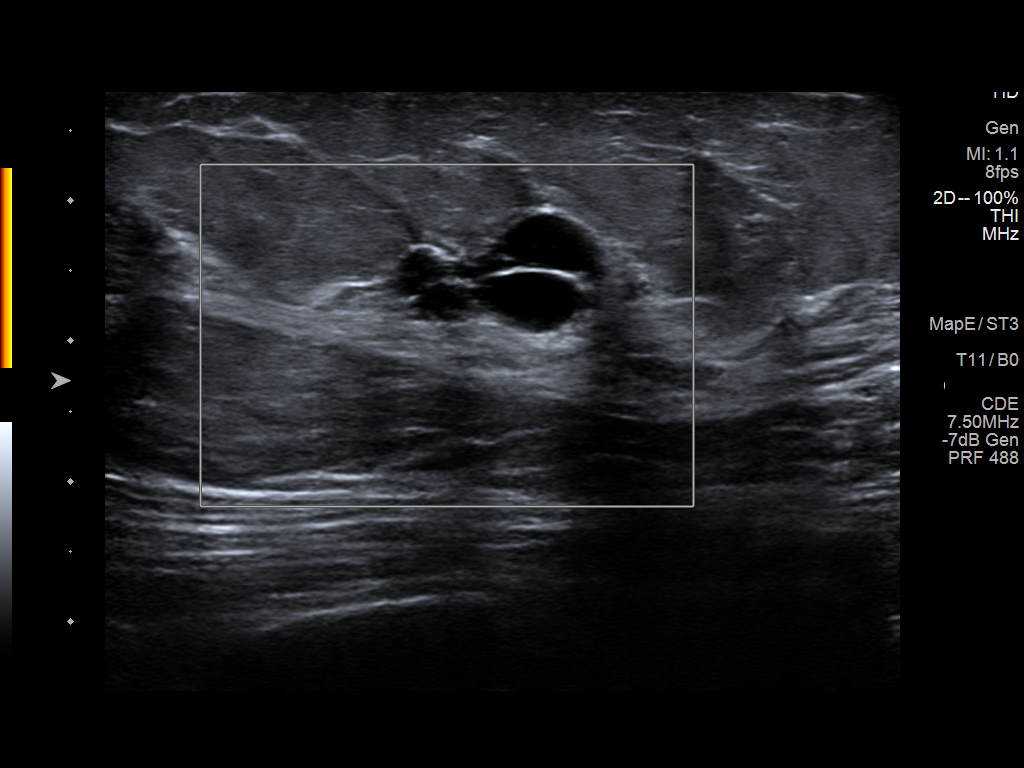
[im 4/5]
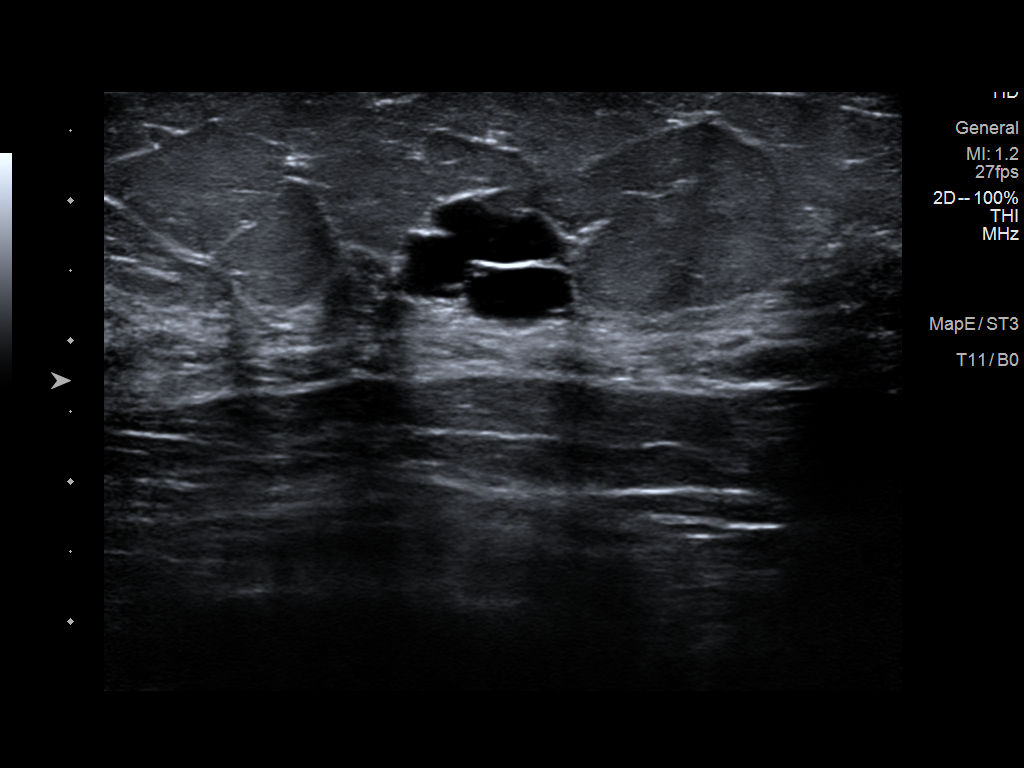
[im 5/5]
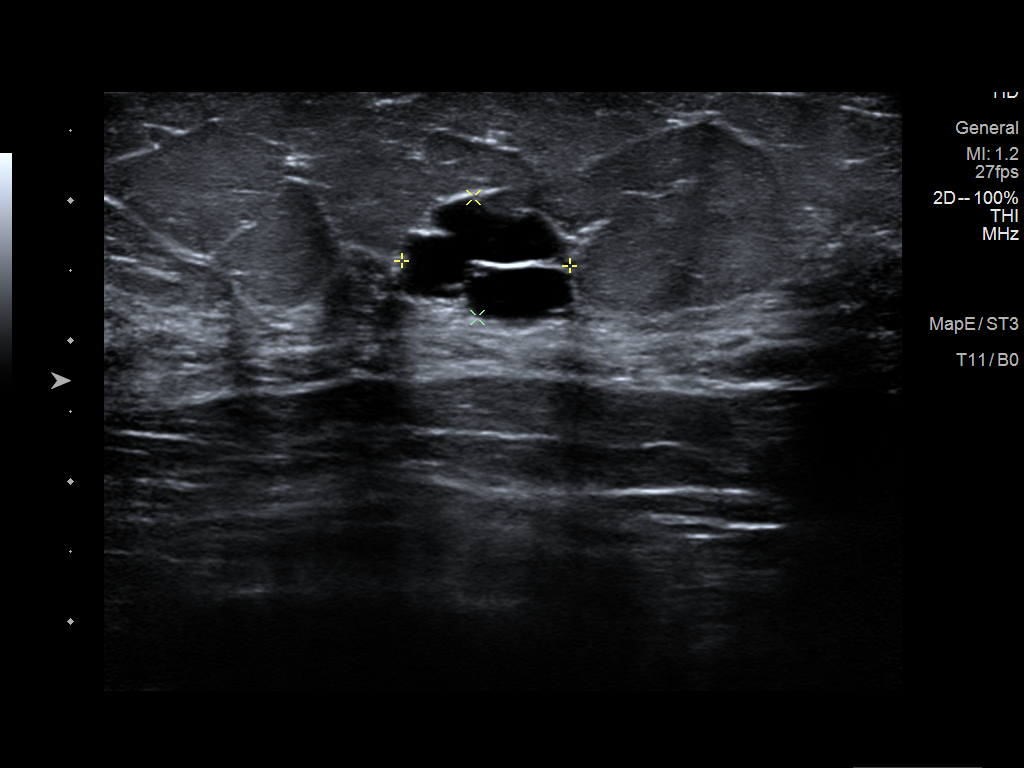

[5 of 5 positions shown; findings below may reference images not displayed]

ACR Breast Density Category c: The breast tissue is heterogeneously
dense, which may obscure small masses.
FINDINGS: 3D tomographic and 2D generated spot compression images of the left
breast confirm an oval, circumscribed mass in the upper-outer
quadrant of each breast.

Targeted ultrasound is performed, showing a 1.6 x 1.2 x 0.9 cm
cluster of cysts in the 10 o'clock position of the right breast, 3
cm from the nipple, corresponding to the mammographic mass on the
right. No internal blood flow with power Doppler.

A 0.0 x 2.4 x 1.3 cm cyst containing a thin internal septation is
demonstrated in the 1:30 o'clock position of the left breast, 7 cm
from the nipple. This corresponds to the mammographic mass on the
left. No internal blood flow with power Doppler. There is an
adjacent smaller, similar-appearing cyst.
IMPRESSION: Bilateral benign breast cysts.  No evidence of malignancy.

RECOMMENDATION:
Bilateral screening mammogram in January 2022 when due.

I have discussed the findings and recommendations with the patient.
If applicable, a reminder letter will be sent to the patient
regarding the next appointment.

BI-RADS CATEGORY  2: Benign.

## 2022-11-10 IMAGING — US US BREAST*L* LIMITED INC AXILLA
1 series · 5 of 5 positions shown · non-contrast
Comparison: Previous exam(s).

CLINICAL DATA: Possible mass in the upper-outer right breast and
possible mass in the upper outer left breast on a recent screening
mammogram without comparisons.



[Series 1: us breast*left* limited inc axilla · 0.07mm/px · 5 of 5 slices shown]
[im 1/5]
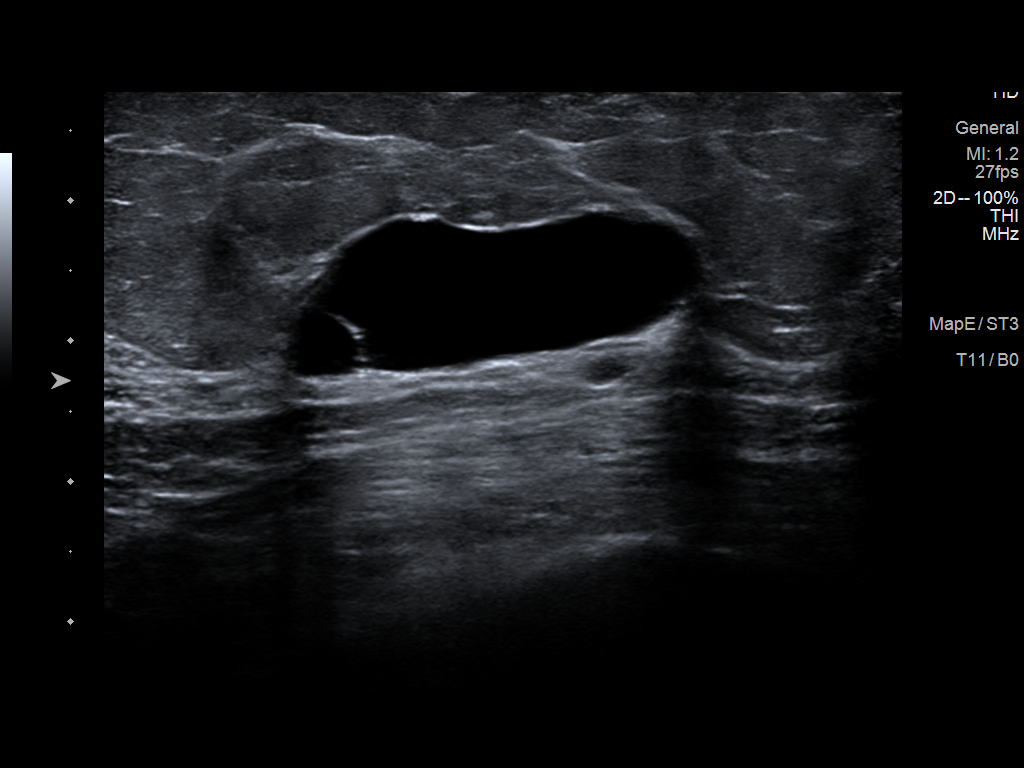
[im 2/5]
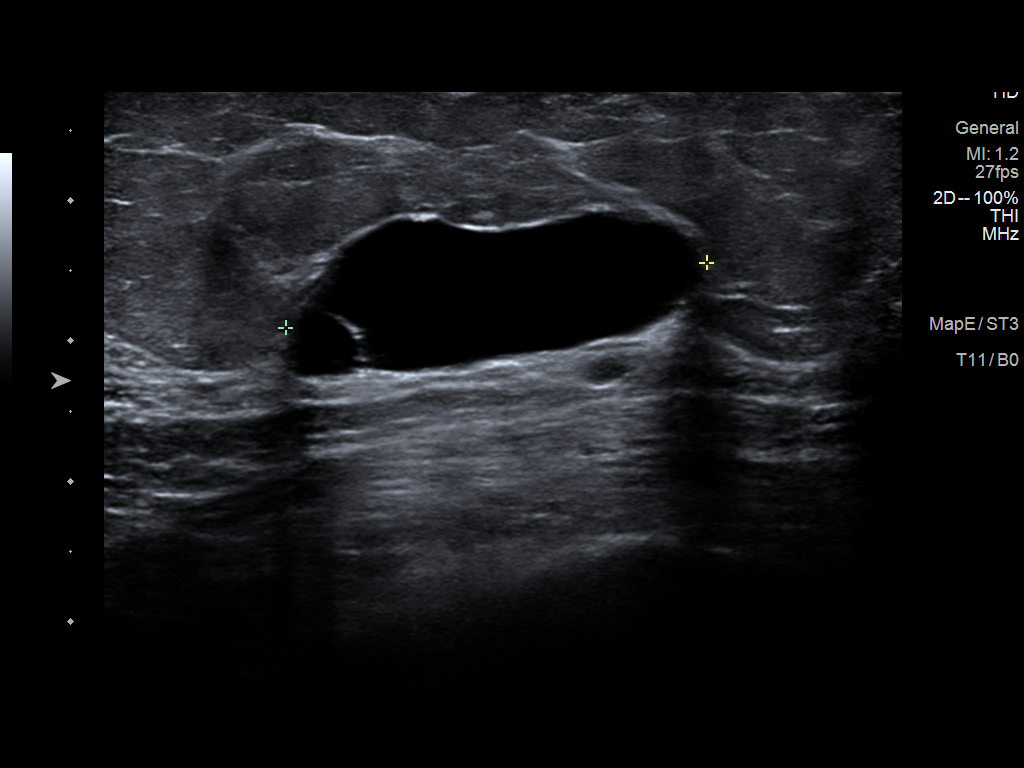
[im 3/5]
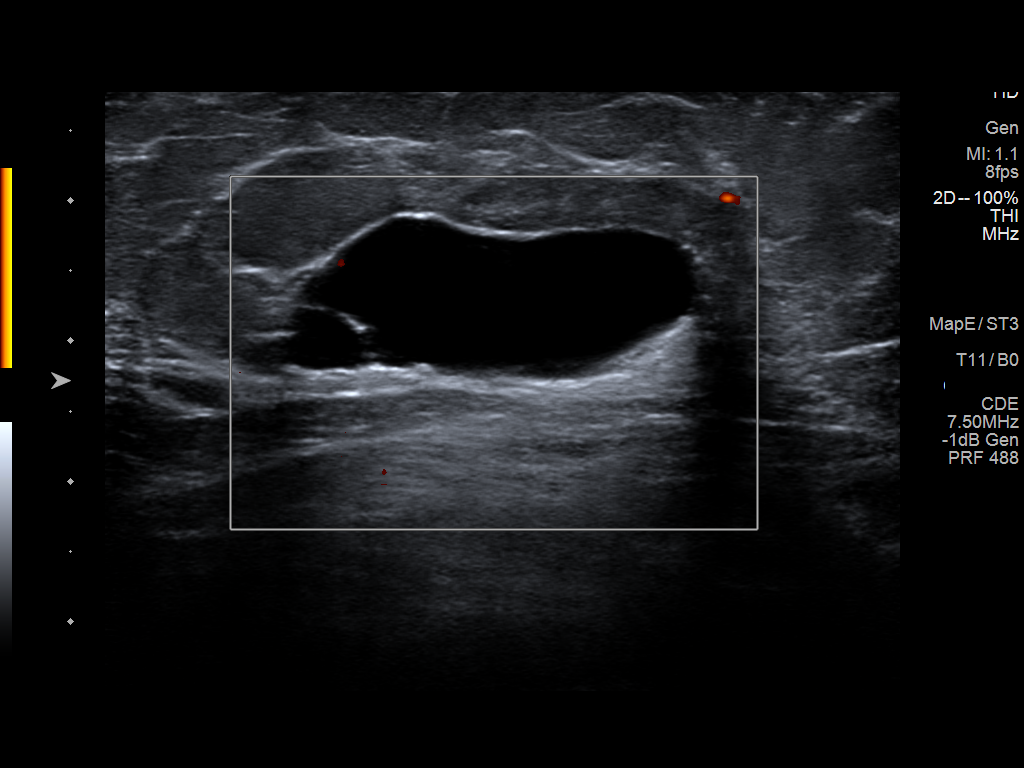
[im 4/5]
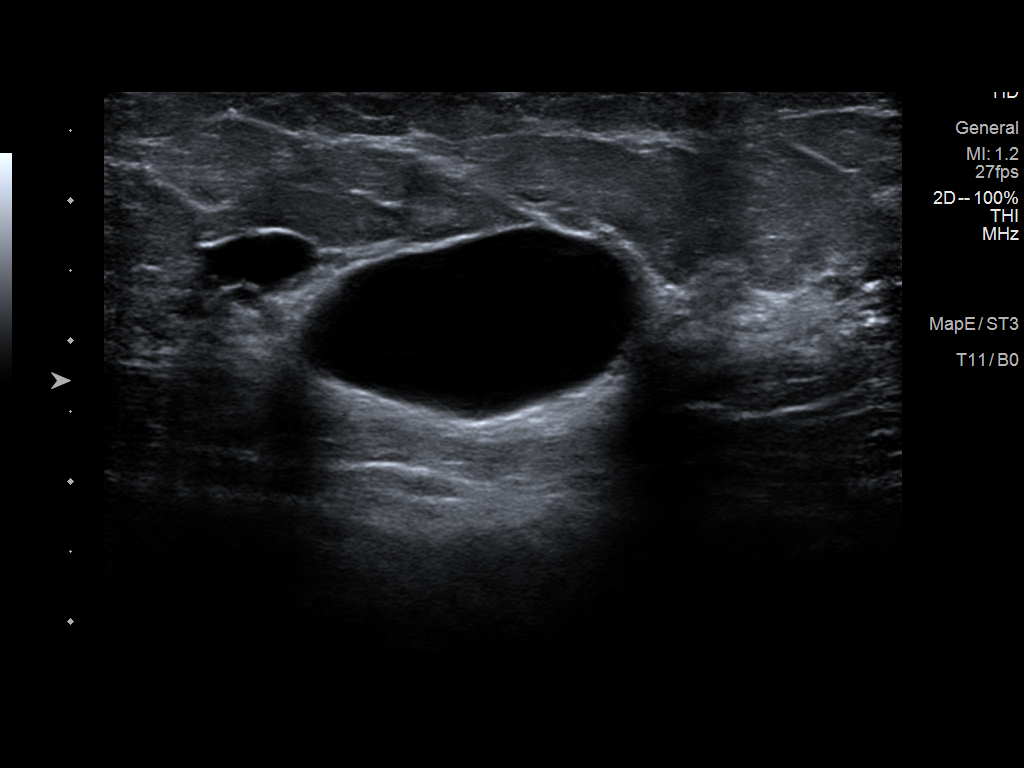
[im 5/5]
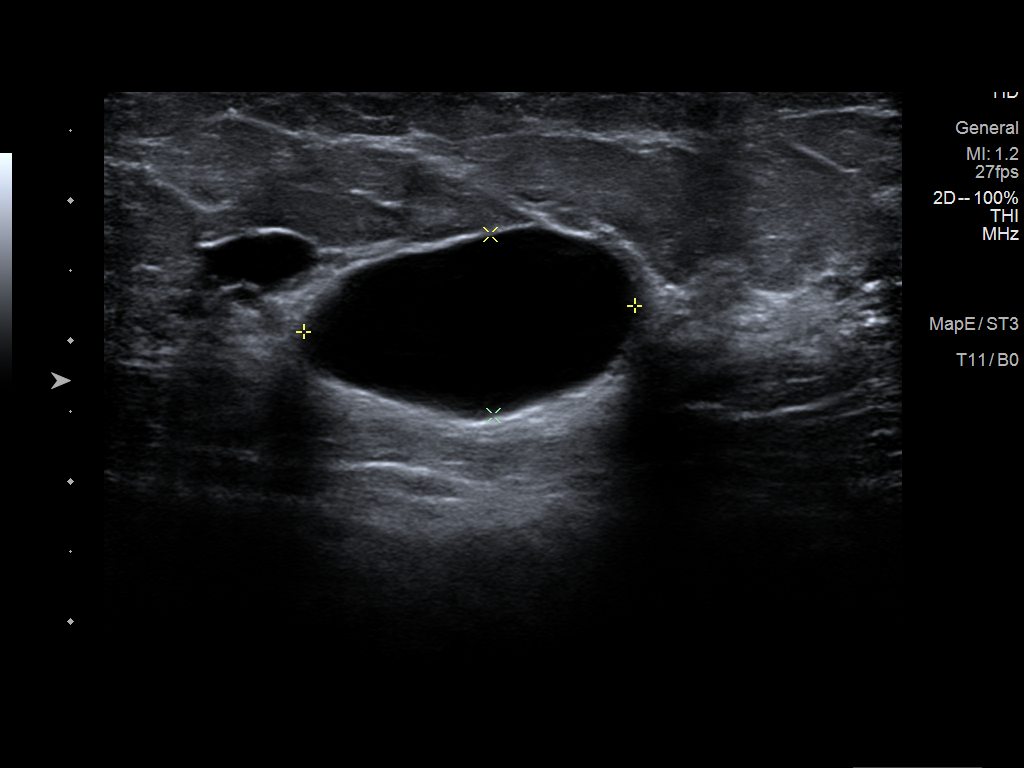

[5 of 5 positions shown; findings below may reference images not displayed]

ACR Breast Density Category c: The breast tissue is heterogeneously
dense, which may obscure small masses.
FINDINGS: 3D tomographic and 2D generated spot compression images of the left
breast confirm an oval, circumscribed mass in the upper-outer
quadrant of each breast.

Targeted ultrasound is performed, showing a 1.6 x 1.2 x 0.9 cm
cluster of cysts in the 10 o'clock position of the right breast, 3
cm from the nipple, corresponding to the mammographic mass on the
right. No internal blood flow with power Doppler.

A 0.0 x 2.4 x 1.3 cm cyst containing a thin internal septation is
demonstrated in the 1:30 o'clock position of the left breast, 7 cm
from the nipple. This corresponds to the mammographic mass on the
left. No internal blood flow with power Doppler. There is an
adjacent smaller, similar-appearing cyst.
IMPRESSION: Bilateral benign breast cysts.  No evidence of malignancy.

RECOMMENDATION:
Bilateral screening mammogram in January 2022 when due.

I have discussed the findings and recommendations with the patient.
If applicable, a reminder letter will be sent to the patient
regarding the next appointment.

BI-RADS CATEGORY  2: Benign.

## 2022-11-13 ENCOUNTER — Other Ambulatory Visit: Payer: Self-pay

## 2022-11-14 ENCOUNTER — Encounter: Payer: Self-pay | Admitting: Family Medicine

## 2022-11-14 MED ORDER — DICLOFENAC SODIUM 1 % EX CREA
TOPICAL_CREAM | CUTANEOUS | 3 refills | Status: AC
Start: 2022-11-14 — End: ?

## 2023-01-16 ENCOUNTER — Encounter: Payer: Self-pay | Admitting: Student

## 2023-01-16 ENCOUNTER — Other Ambulatory Visit: Payer: Self-pay

## 2023-01-16 ENCOUNTER — Ambulatory Visit (INDEPENDENT_AMBULATORY_CARE_PROVIDER_SITE_OTHER): Payer: 59 | Admitting: Student

## 2023-01-16 VITALS — BP 125/83 | HR 92 | Ht 65.0 in | Wt 262.8 lb

## 2023-01-16 DIAGNOSIS — H9312 Tinnitus, left ear: Secondary | ICD-10-CM | POA: Insufficient documentation

## 2023-01-16 NOTE — Patient Instructions (Signed)
It was great to see you! Thank you for allowing me to participate in your care!  Our plans for today:  - Tinnitus   This sound you hear in your ear is coming from your heart beat. This can sometimes be associated with abnormal blood vessels and tumors in the anatomy of the ear, and other times it can be benign. We are sending you to an Ear Nose and Throat specialist to be evaluated.   -Referral to Ear Nose and Throt (ENT) specialist *If you haven't heard anything about an appointment in 2 weeks, call the clinic and ask about the referral.  Take care and seek immediate care sooner if you develop any concerns.   Dr. Bess Kinds, MD [facility]

## 2023-01-16 NOTE — Assessment & Plan Note (Addendum)
Patient appreciates hissing, pulsating, noise, and left ear that has been going on for nearly a week.  Patient denies any history of trauma, is in normal state of health, denies any systemic symptoms.  Patient denies any balance issues, hearing loss, vision changes.  Patient appreciates pulsatile noise correlates with her heartbeat.  Patient symptoms most concerning for pulsatile tinnitus.  Low concern for Mnire's, or systemic infection, or medication causing this issue.  Will refer to ear nose and throat, for possible AVM, or tumor.  Discussed potential etiologies with patient, and plan to follow-up with ENT. - ENT referral

## 2023-01-16 NOTE — Progress Notes (Signed)
  SUBJECTIVE:   CHIEF COMPLAINT / HPI:   Ear Problem 5-6 days ago, started hearing high pitch hissing sound in left ear. Ear canal also feels irritated. No ear pain. Hearing is okay, no balance or vision issues. Feeling normal state of health, no systemic symptoms. No ear trauma. Sound can sometimes be masked if she is preoccupied, and at other times is more distracting. Pulses with her heart beat.    PERTINENT  PMH / PSH:    OBJECTIVE:  BP 125/83   Pulse 92   Ht 5\' 5"  (1.651 m)   Wt 262 lb 12.8 oz (119.2 kg)   SpO2 100%   BMI 43.73 kg/m  Physical Exam Constitutional:      General: She is not in acute distress.    Appearance: Normal appearance. She is not ill-appearing.  HENT:     Right Ear: Tympanic membrane, ear canal and external ear normal. There is no impacted cerumen.     Left Ear: Tympanic membrane, ear canal and external ear normal. There is no impacted cerumen.  Neurological:     Mental Status: She is alert.      ASSESSMENT/PLAN:   Assessment & Plan Tinnitus of left ear Patient appreciates hissing, pulsating, noise, and left ear that has been going on for nearly a week.  Patient denies any history of trauma, is in normal state of health, denies any systemic symptoms.  Patient denies any balance issues, hearing loss, vision changes.  Patient appreciates pulsatile noise correlates with her heartbeat.  Patient symptoms most concerning for pulsatile tinnitus.  Low concern for Mnire's, or systemic infection, or medication causing this issue.  Will refer to ear nose and throat, for possible AVM, or tumor.  Discussed potential etiologies with patient, and plan to follow-up with ENT. - ENT referral No follow-ups on file. Bess Kinds, MD 01/16/2023, 10:13 AM PGY-3, [facility]

## 2023-01-30 LAB — HM MAMMOGRAPHY

## 2023-02-04 ENCOUNTER — Ambulatory Visit: Payer: Self-pay | Admitting: Surgery

## 2023-02-04 ENCOUNTER — Encounter: Payer: Self-pay | Admitting: Surgery

## 2023-02-04 DIAGNOSIS — Z860101 Personal history of adenomatous and serrated colon polyps: Secondary | ICD-10-CM | POA: Insufficient documentation

## 2023-02-05 ENCOUNTER — Encounter: Payer: Self-pay | Admitting: Family Medicine

## 2023-02-05 DIAGNOSIS — K603 Anal fistula, unspecified: Secondary | ICD-10-CM | POA: Insufficient documentation

## 2023-03-08 NOTE — Progress Notes (Unsigned)
  [address] [place], [place] [address] [phone]  Audiological Evaluation    Name: Jenny Morales     DOB:   Nov 12, 1970      MRN:   [id number]                                                                                     Service Date: 03/08/2023     Accompanied by: unaccompanied   Patient comes today after Dr. Allena Katz, ENT sent a referral for a hearing evaluation due to concerns with tinnitus.   Symptoms Yes Details  Hearing loss  []    Tinnitus  [x]  Left pulsatile tinnitus, onset November after stressful situation at work- reports it to be high pitched with a pulsatile sort of sizzling sound  that may match her heartbeat.  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 Left ear:  Clear external ear canals and notable landmarks visualized on the tympanic membrane.  Tympanometry: Right ear: Type Ad- Normal external ear canal volume with normal middle ear pressure and high tympanic membrane compliance Left ear: Type A- Normal external ear canal volume with normal middle ear pressure and tympanic membrane compliance     Pure tone Audiometry: Right ear-  Normal to borderline normal hearing  from 250 Hz - 8000 Hz. Left ear-  Normal to borderline normal hearing  from 250 Hz - 8000 Hz.  The hearing test results were completed under headphones and results are deemed to be of good reliability. Test technique:  conventional     Speech Audiometry: Right ear- Speech Reception Threshold (SRT) was obtained at 15 dBHL Left ear-Speech Reception Threshold (SRT) was obtained at 10 dBHL   Word Recognition Score Tested using NU-6 (MLV) Right ear: 100% was obtained at a presentation level  of 55 dBHL with contralateral masking which is deemed as  excellent Left ear: 100% was obtained at a presentation level of 55 dBHL with contralateral masking which is deemed as  excellent    Impression: There is not a significant difference in pure-tone thresholds between ears. There is not a significant difference in the word recognition score in between ears.    Recommendations: Follow up with ENT as scheduled for today. Consider various tinnitus strategies, including the use of a sound generator, and/or tinnitus retraining therapy.    Jenny Morales, AUD

## 2023-03-11 ENCOUNTER — Ambulatory Visit (INDEPENDENT_AMBULATORY_CARE_PROVIDER_SITE_OTHER): Payer: 59 | Admitting: Otolaryngology

## 2023-03-11 ENCOUNTER — Encounter (INDEPENDENT_AMBULATORY_CARE_PROVIDER_SITE_OTHER): Payer: Self-pay

## 2023-03-11 ENCOUNTER — Ambulatory Visit (INDEPENDENT_AMBULATORY_CARE_PROVIDER_SITE_OTHER): Payer: 59 | Admitting: Audiology

## 2023-03-11 VITALS — BP 114/81 | HR 78 | Resp 19 | Ht 65.5 in | Wt 262.0 lb

## 2023-03-11 DIAGNOSIS — H93A9 Pulsatile tinnitus, unspecified ear: Secondary | ICD-10-CM

## 2023-03-11 DIAGNOSIS — Z0110 Encounter for examination of ears and hearing without abnormal findings: Secondary | ICD-10-CM | POA: Diagnosis not present

## 2023-03-11 DIAGNOSIS — R0981 Nasal congestion: Secondary | ICD-10-CM

## 2023-03-11 DIAGNOSIS — H93A2 Pulsatile tinnitus, left ear: Secondary | ICD-10-CM | POA: Diagnosis not present

## 2023-03-11 MED ORDER — FLUTICASONE PROPIONATE 50 MCG/ACT NA SUSP: 2.0000 | Freq: Every day | NASAL | 5 refills | Status: DC

## 2023-03-11 NOTE — Patient Instructions (Addendum)
I have ordered an imaging study for you to complete prior to your next visit. Please call [facility] Scheduling at [phone] to schedule your imaging if you have not received a call within 24 hours. If you are unable to complete your imaging study prior to your next scheduled visit please call our office to let us know.   Use flonase spray in your nose for congestion - use two sprays each nostril twice per day for 2 months.

## 2023-03-11 NOTE — Progress Notes (Signed)
Dear Dr. Lum Babe, Here is my assessment for our mutual patient, Jenny Morales. Thank you for allowing me the opportunity to care for your patient. Please do not hesitate to contact me should you have any other questions. Sincerely, Dr. Jovita Kussmaul  Otolaryngology Clinic Note Referring provider: Dr. Lum Babe HPI:  Jenny Morales is a 52 y.o. female kindly referred by Dr. Lum Babe for evaluation of tinnitus and nasal congestion  Initial visit (02/2023): Patient reports: started early November. She reports that she notices it most when she is laying down or sitting still, not around noise. She thinks it is coming from the left but reports hard to tell. High pitched, feels like it is going along with her heartbeat. Constant. She thinks maybe stress brought it about. She has had history of intermittent tinnitus but now this is persistent. No trauma to ear. Can't make it stop. Sometimes opening jaw makes it worse. No change in hearing. Patient denies: ear pain, fullness, vertigo, drainage Patient additionally denies: deep pain in ear canal, eustachian tube symptoms such as popping, crackling, sensitivity to pressure changes Patient also denies barotrauma, vestibular suppressant use, ototoxic medication use Prior ear surgery: no No significant noise exposure.  Of note, she also seems to have alternating nasal congestion. No significant obstruction. No facial pressure/pain, freq sinus infxn. No prior sinus CT. Not using any nasal sprays  H&N Surgery: no Personal or FHx of bleeding dz or anesthesia difficulty: no  Pmhx: HTN, Obesity, Anxiety  GLP-1: not currently but planning to start [facility] AP/AC: no  Tobacco: smoked 10 pack year - quit several years ago. Occupation: Psychologist, occupational. Lives in [place]  Independent Review of Additional Tests or Records:  Dr. Charlyne Mom referral notes (01/16/2023): Noted tinnitus ("high pitched hissing sound" left ear) - started in early Nov. Ear canal  feels irritated, no HL, no pain, no balance or vision issue. Sometimes masked if preoccupied. Pulses with heart beat. No trauma, no other sx. Ref to ENT.  Dr. Manson Passey (10/2022): PMHx HTN, Wt gain Labs reviewed: 10/19/2022: BMP generally wnl; CBC 10/2021: wnl 03/11/2023 Audiogram was independently reviewed and interpreted by me and I agree with read: Tympanometry: Tymps: AD Ad, AS A; Pure tone Audiometry: Right ear-  Normal to borderline normal hearing  from 250 Hz - 8000 Hz. Left ear-  Normal to borderline normal hearing  from 250 Hz - 8000 Hz.   Word Recognition Score Tested using NU-6 (MLV) Right ear: 100% was obtained at a presentation level of 55 dBHL with contralateral masking which is deemed as  excellent Left ear: 100% was obtained at a presentation level of 55 dBHL with contralateral masking which is deemed as  excellent  SNHL= Sensorineural hearing loss   PMH/Meds/All/SocHx/FamHx/ROS:   Past Medical History:  Diagnosis Date   Anxiety    generalized anxiety disorder with a history of panic attatcks   Arthritis 05/30/2021   knees   Heart murmur    Per pt, heart mumrmur was from having rheumatic fever as a child. Pt was on PCN for years. In 92 's, pt stated that she no longer had a heart murmur and was taken off the PCN.   History of palpitations 11/30/2016   normal EKG, MD note in Epic  states suspected underlying anxiety   Hypertension 01/11/2021   Insomnia 2022   Obesity    Wears glasses      Past Surgical History:  Procedure Laterality Date   COLONOSCOPY  02/13/2021   colon polyps   HYSTERECTOMY ABDOMINAL  WITH SALPINGECTOMY Bilateral 06/12/2021   Procedure: TOTAL ABDOMINAL HYSTERECTOMY, BILATERAL SALPINGECTOMY;  Surgeon: Mitchel Honour, DO;  Location: [facility];  Service: Gynecology;  Laterality: Bilateral;   POLYPECTOMY     SALPINGOOPHORECTOMY Bilateral 06/12/2021   Procedure: OPEN SALPINGO OOPHORECTOMY;  Surgeon: Mitchel Honour, DO;  [facility];  Service: Gynecology;  Laterality: Bilateral;   TONSILLECTOMY AND ADENOIDECTOMY     When pt was around 52 years old.    Family History  Problem Relation Age of Onset   Multiple myeloma Mother 67       died at age 4   Drug abuse Father        died at age 45. also had cancer    Cancer Father        unknown kind    Brain cancer Other        neice   Breast cancer Neg Hx    Colon cancer Neg Hx    Colon polyps Neg Hx    Esophageal cancer Neg Hx    Stomach cancer Neg Hx    Rectal cancer Neg Hx    Cancer - Colon Neg Hx    Ovarian cancer Neg Hx    Endometrial cancer Neg Hx    Pancreatic cancer Neg Hx    Prostate cancer Neg Hx      Social Connections: Not on file      Current Outpatient Medications:    amLODipine (NORVASC) 10 MG tablet, Take 1 tablet (10 mg total) by mouth daily., Disp: 90 tablet, Rfl: 3   Cholecalciferol (VITAMIN D3) 125 MCG (5000 UT) CAPS, Take by mouth., Disp: , Rfl:    Diclofenac Sodium 1 % CREA, Apply 4 times daily to affected area, Disp: 120 g, Rfl: 3   fluticasone (FLONASE) 50 MCG/ACT nasal spray, Place 2 sprays into both nostrils daily., Disp: 11 mL, Rfl: 5   Ginkgo Biloba 40 MG TABS, Take 1 tablet by mouth daily., Disp: , Rfl:    hydrOXYzine (ATARAX) 25 MG tablet, Take 1 tablet (25 mg total) by mouth at bedtime as needed for anxiety., Disp: 90 tablet, Rfl: 3   Omega-3 Fatty Acids (FISH OIL) 1000 MG CAPS, Take by mouth., Disp: , Rfl:    Semaglutide-Weight Management (WEGOVY) 0.25 MG/0.5ML SOAJ, Inject 0.25 mg into the skin once a week. (Patient not taking: Reported on 03/11/2023), Disp: 2 mL, Rfl: 1   Physical Exam:   BP 114/81 (BP Location: Left Arm, Patient Position: Sitting, Cuff Size: Normal)   Pulse 78   Resp 19   Ht 5' 5.5" (1.664 m)   Wt 262 lb (118.8 kg)   LMP 06/07/2021 (Exact Date)   SpO2 98%   BMI 42.94 kg/m   Salient findings:  CN II-XII intact No audible objective tinnitus Palate normal, no fluttering or clonus No  bruits, unable to stop with head turn or compression of neck  Bilateral EAC clear and TM intact with well pneumatized middle ear spaces; no pulsatile or ME masses noted. Given history and complaints, ear microscopy was indicated and performed for evaluation with findings as below in physical exam section and in procedures Weber 512: mid [place] 512: AC > BC b/l  Anterior rhinoscopy: Septum intact; bilateral inferior turbinates without significant hypertrophy No lesions of oral cavity/oropharynx; dentition good No obviously palpable neck masses/lymphadenopathy/thyromegaly No respiratory distress or stridor  Seprately Identifiable Procedures:  Procedure: Bilateral ear microscopy using microscope (CPT 92504) Pre-procedure diagnosis: pulsatile tinnitus Post-procedure diagnosis: same Indication:  Concern for pulsatile tinnitus; given patient's otologic complaints and history, for improved and comprehensive examination of external ear and tympanic membrane, bilateral otologic examination using microscope was performed  Procedure: Patient was placed semi-recumbent. Both ear canals were examined using the microscope with findings above. Patient tolerated the procedure well.   Impression & Plans:  Jenny Morales is a 52 y.o. female with:  1. Pulsatile tinnitus   2. Nasal congestion    She appears to have pulsatile tinnitus which is unilateral (left) based on exam. Exam is otherwise reassuring and unremkarable without bruits or other pathology on ME exam. She is not having any other symptoms, and nothing seems to relieve it. Audiogram is generally reassuring without significant asymmetric HL or other findings We discussed options: Observation - would not recommend Further imaging to rule out vascular causes as she does not have any other risk factors for tinnitus - given high pitch rather than described venous hum, will get CTA in addition to MRV. Will also get MRI IAC/Brain to rule out any other  causes. Considered CT temporal bone but will avoid currently since CTA may give [place] some info Formal angiogram - invasive, will perform imaging first  Nasal congestion: alternating, no masses on anterior rhino; will start with flonase; if persistent or no improvement, will consider rigid endo at next visit  - f/u in 3 months for recheck  See below regarding exact medications prescribed this encounter including dosages and route: Meds ordered this encounter  Medications   fluticasone (FLONASE) 50 MCG/ACT nasal spray    Sig: Place 2 sprays into both nostrils daily.    Dispense:  11 mL    Refill:  5      Thank you for allowing me the opportunity to care for your patient. Please do not hesitate to contact me should you have any other questions.  Sincerely, Jovita Kussmaul, MD Otolarynoglogist (ENT), [facility] Phone: [phone] Fax: [phone]  03/11/2023, 9:16 AM   MDM:  Level 4 Complexity/Problems addressed: new problem, but unknown prognosis requiring further testing and evaluation Data complexity: mod - independent review of notes, labs, tests; ordering Imaging - Morbidity: mod  - Prescription Drug prescribed or managed: yes

## 2023-03-14 ENCOUNTER — Ambulatory Visit (HOSPITAL_COMMUNITY)
Admission: RE | Admit: 2023-03-14 | Discharge: 2023-03-14 | Disposition: A | Discharge status: Home / Self Care | Payer: 59 | Source: Ambulatory Visit | Attending: Otolaryngology | Admitting: Otolaryngology

## 2023-03-14 DIAGNOSIS — H93A9 Pulsatile tinnitus, unspecified ear: Secondary | ICD-10-CM

## 2023-03-14 MED ORDER — GADOBUTROL 1 MMOL/ML IV SOLN
10.0000 mL | Freq: Once | INTRAVENOUS | Status: AC | PRN
  Administered 2023-03-14: 10 mL via INTRAVENOUS

## 2023-05-31 ENCOUNTER — Telehealth (INDEPENDENT_AMBULATORY_CARE_PROVIDER_SITE_OTHER): Payer: Self-pay | Admitting: Otolaryngology

## 2023-05-31 NOTE — Telephone Encounter (Signed)
 Reminder Call:  Date: 06/03/2023 Status: Sch  Time: 9:00 AM Confirmed time and location-[address] [place], [place] [address]

## 2023-06-03 ENCOUNTER — Encounter (INDEPENDENT_AMBULATORY_CARE_PROVIDER_SITE_OTHER): Payer: Self-pay

## 2023-06-03 ENCOUNTER — Ambulatory Visit (INDEPENDENT_AMBULATORY_CARE_PROVIDER_SITE_OTHER): Payer: 59 | Admitting: Otolaryngology

## 2023-06-03 VITALS — BP 167/90 | HR 89 | Ht 65.5 in | Wt 270.0 lb

## 2023-06-03 DIAGNOSIS — H93A9 Pulsatile tinnitus, unspecified ear: Secondary | ICD-10-CM | POA: Diagnosis not present

## 2023-06-03 DIAGNOSIS — R0981 Nasal congestion: Secondary | ICD-10-CM | POA: Diagnosis not present

## 2023-06-03 DIAGNOSIS — Z0110 Encounter for examination of ears and hearing without abnormal findings: Secondary | ICD-10-CM | POA: Diagnosis not present

## 2023-06-03 NOTE — Progress Notes (Signed)
 Dear Dr. Manson Passey, Here is my assessment for our mutual patient, Jenny Morales. Thank you for allowing me the opportunity to care for your patient. Please do not hesitate to contact me should you have any other questions. Sincerely, Dr. Jovita Kussmaul  Otolaryngology Clinic Note Referring provider: Dr. Manson Passey HPI:  Jenny Morales is a 53 y.o. female kindly referred by Dr. Manson Passey for evaluation of tinnitus and nasal congestion  Initial visit (02/2023): Patient reports: started early November. She reports that she notices it most when she is laying down or sitting still, not around noise. She thinks it is coming from the left but reports hard to tell. High pitched, feels like it is going along with her heartbeat. Constant. She thinks maybe stress brought it about. She has had history of intermittent tinnitus but now this is persistent. No trauma to ear. Can't make it stop. Sometimes opening jaw makes it worse. No change in hearing. Patient denies: ear pain, fullness, vertigo, drainage Patient additionally denies: deep pain in ear canal, eustachian tube symptoms such as popping, crackling, sensitivity to pressure changes Patient also denies barotrauma, vestibular suppressant use, ototoxic medication use Prior ear surgery: no No significant noise exposure.  Of note, she also seems to have alternating nasal congestion. No significant obstruction. No facial pressure/pain, freq sinus infxn. No prior sinus CT. Not using any nasal sprays  --------------------------------------------------------- 06/03/2023 She reports that the tinnitus is better. Feels like maybe anxiety has to do with it because she really only notices it in anxiety-provoking or stressful situations. No ear pain, fullness, vertigo, drainage, popping/crackling.  We did discuss her MRI; she did not get the CT ---------------------------------------------------------------  H&N Surgery: no Personal or FHx of bleeding dz or anesthesia  difficulty: no  Pmhx: HTN, Obesity, Anxiety  GLP-1: not currently  AP/AC: no  Tobacco: smoked 10 pack year - quit several years ago. Occupation: Psychologist, occupational. Lives in [place]  Independent Review of Additional Tests or Records:  Dr. Charlyne Mom referral notes (01/16/2023): Noted tinnitus ("high pitched hissing sound" left ear) - started in early Nov. Ear canal feels irritated, no HL, no pain, no balance or vision issue. Sometimes masked if preoccupied. Pulses with heart beat. No trauma, no other sx. Ref to ENT.  Dr. Manson Passey (10/2022): PMHx HTN, Wt gain Labs reviewed: 10/19/2022: BMP generally wnl; CBC 10/2021: wnl 03/11/2023 Audiogram was independently reviewed and interpreted by me and I agree with read: Tympanometry: Tymps: AD Ad, AS A; Pure tone Audiometry: Right ear-  Normal to borderline normal hearing  from 250 Hz - 8000 Hz. Left ear-  Normal to borderline normal hearing  from 250 Hz - 8000 Hz.   Word Recognition Score Tested using NU-6 (MLV) Right ear: 100% was obtained at a presentation level of 55 dBHL with contralateral masking which is deemed as  excellent Left ear: 100% was obtained at a presentation level of 55 dBHL with contralateral masking which is deemed as  excellent    SNHL= Sensorineural hearing loss  MRI IAC and MRV reviewed (03/14/2023) and independently interpreted: No IAC lesions, large encephaloceles or venous stenosis to explain tinnitus. No mastoid or ME effusion  PMH/Meds/All/SocHx/FamHx/ROS:   Past Medical History:  Diagnosis Date   Anxiety    generalized anxiety disorder with a history of panic attatcks   Arthritis 05/30/2021   knees   Heart murmur    Per pt, heart mumrmur was from having rheumatic fever as a child. Pt was on PCN for years. In 13 's, pt stated that  she no longer had a heart murmur and was taken off the PCN.   History of palpitations 11/30/2016   normal EKG, MD note in Epic  states suspected underlying anxiety   Hypertension  01/11/2021   Insomnia 2022   Obesity    Wears glasses      Past Surgical History:  Procedure Laterality Date   COLONOSCOPY  02/13/2021   colon polyps   HYSTERECTOMY ABDOMINAL WITH SALPINGECTOMY Bilateral 06/12/2021   Procedure: TOTAL ABDOMINAL HYSTERECTOMY, BILATERAL SALPINGECTOMY;  Surgeon: Mitchel Honour, DO;  Location: [facility];  Service: Gynecology;  Laterality: Bilateral;   POLYPECTOMY     SALPINGOOPHORECTOMY Bilateral 06/12/2021   Procedure: OPEN SALPINGO OOPHORECTOMY;  Surgeon: Mitchel Honour, DO;  Location[facility];  Service: Gynecology;  Laterality: Bilateral;   TONSILLECTOMY AND ADENOIDECTOMY     When pt was around 53 years old.    Family History  Problem Relation Age of Onset   Multiple myeloma Mother 50       died at age 47   Drug abuse Father        died at age 26. also had cancer    Cancer Father        unknown kind    Brain cancer Other        neice   Breast cancer Neg Hx    Colon cancer Neg Hx    Colon polyps Neg Hx    Esophageal cancer Neg Hx    Stomach cancer Neg Hx    Rectal cancer Neg Hx    Cancer - Colon Neg Hx    Ovarian cancer Neg Hx    Endometrial cancer Neg Hx    Pancreatic cancer Neg Hx    Prostate cancer Neg Hx      Social Connections: Not on file      Current Outpatient Medications:    amLODipine (NORVASC) 10 MG tablet, Take 1 tablet (10 mg total) by mouth daily., Disp: 90 tablet, Rfl: 3   Cholecalciferol (VITAMIN D3) 125 MCG (5000 UT) CAPS, Take by mouth., Disp: , Rfl:    Diclofenac Sodium 1 % CREA, Apply 4 times daily to affected area, Disp: 120 g, Rfl: 3   Ginkgo Biloba 40 MG TABS, Take 1 tablet by mouth daily., Disp: , Rfl:    hydrOXYzine (ATARAX) 25 MG tablet, Take 1 tablet (25 mg total) by mouth at bedtime as needed for anxiety., Disp: 90 tablet, Rfl: 3   Omega-3 Fatty Acids (FISH OIL) 1000 MG CAPS, Take by mouth., Disp: , Rfl:    fluticasone (FLONASE) 50 MCG/ACT nasal spray, Place 2 sprays into both  nostrils daily. (Patient not taking: Reported on 06/03/2023), Disp: 11 mL, Rfl: 5   Semaglutide-Weight Management (WEGOVY) 0.25 MG/0.5ML SOAJ, Inject 0.25 mg into the skin once a week. (Patient not taking: Reported on 06/03/2023), Disp: 2 mL, Rfl: 1   Physical Exam:   BP (!) 167/90 (BP Location: Left Arm, Patient Position: Sitting, Cuff Size: Large)   Pulse 89   Ht 5' 5.5" (1.664 m)   Wt 270 lb (122.5 kg)   LMP 06/07/2021 (Exact Date)   SpO2 99%   BMI 44.25 kg/m   Salient findings:  CN II-XII intact No audible objective tinnitus Palate normal, no fluttering or clonus No bruits, unable to stop with head turn or compression of neck  Bilateral EAC clear and TM intact with well pneumatized middle ear spaces; no pulsatile or ME masses noted.  Weber 512: mid [employer]  512: AC > BC b/l  Anterior rhinoscopy: Septum intact; bilateral inferior turbinates without significant hypertrophy No lesions of oral cavity/oropharynx; dentition good No obviously palpable neck masses/lymphadenopathy/thyromegaly No respiratory distress or stridor  Seprately Identifiable Procedures:  None   Impression & Plans:  Jenny Morales is a 53 y.o. female with:  1. Pulsatile tinnitus   2. Hearing within normal limits in both ears   3. Nasal congestion    She appears to have pulsatile tinnitus which is unilateral (left) based on exam. Exam is otherwise reassuring and unremkarable without bruits or other pathology on ME exam. She is not having any other symptoms, and reports that it is provoked by anxiety. Audiogram is generally reassuring without significant asymmetric HL or other findings We discussed her MRI and MRV. She did not get a CTA We discussed options: Observation Further imaging to rule out vascular causes as she does not have any other risk factors for tinnitus - given high pitch rather than described venous hum, CTA; Formal angiogram  She is not interested in further workup after MRI; we did discuss  risks of incomplete workup. Discussed follow up, and she would like to f/u PRN  Nasal congestion: resolved; MRI did not show significant sinonasal disease; Flonase trial if persists  See below regarding exact medications prescribed this encounter including dosages and route: No orders of the defined types were placed in this encounter.     Thank you for allowing me the opportunity to care for your patient. Please do not hesitate to contact me should you have any other questions.  Sincerely, Jovita Kussmaul, MD Otolaryngologist (ENT), [facility] Phone: [phone] Fax: [phone]  06/03/2023, 9:42 AM   MDM:  Level 4 - 99214 Complexity/Problems addressed: mod - chronic problems  Data complexity: mod - independent interpretation of MRI imaging - Morbidity: low - Prescription Drug prescribed or managed: no

## 2023-06-20 ENCOUNTER — Ambulatory Visit: Payer: Self-pay | Admitting: Internal Medicine

## 2023-06-20 VITALS — BP 124/80 | HR 82 | Temp 98.1°F | Ht 65.0 in | Wt 282.4 lb

## 2023-06-20 DIAGNOSIS — Z7689 Persons encountering health services in other specified circumstances: Secondary | ICD-10-CM

## 2023-06-20 DIAGNOSIS — E66813 Obesity, class 3: Secondary | ICD-10-CM | POA: Diagnosis not present

## 2023-06-20 DIAGNOSIS — I10 Essential (primary) hypertension: Secondary | ICD-10-CM | POA: Diagnosis not present

## 2023-06-20 DIAGNOSIS — R7303 Prediabetes: Secondary | ICD-10-CM | POA: Diagnosis not present

## 2023-06-20 DIAGNOSIS — N951 Menopausal and female climacteric states: Secondary | ICD-10-CM

## 2023-06-20 DIAGNOSIS — M722 Plantar fascial fibromatosis: Secondary | ICD-10-CM

## 2023-06-20 DIAGNOSIS — Z6841 Body Mass Index (BMI) 40.0 and over, adult: Secondary | ICD-10-CM

## 2023-06-20 DIAGNOSIS — F411 Generalized anxiety disorder: Secondary | ICD-10-CM | POA: Diagnosis not present

## 2023-06-20 NOTE — Progress Notes (Signed)
 I,Victoria T Basil Lim, CMA,acting as a Neurosurgeon for Smiley Dung, MD.,have documented all relevant documentation on the behalf of Smiley Dung, MD,as directed by  Smiley Dung, MD while in the presence of Smiley Dung, MD.  Subjective:  Patient ID: Jenny Morales , female    DOB: 03-25-1970 , 53 y.o.   MRN: [id number]  Chief Complaint  Patient presents with   Establish Care    Patient presents today to establish care. Previous pcp: [facility]. She was referred here by her spouse. While here today she would like to discuss menopause & weight gain. She experiences night sweats & hot flashes.  She does want to start a weight loss medication. She had been prescribed previously Wegovy . She never did start medication, too costly. Insurance did not cover medication.     HPI Discussed the use of AI scribe software for clinical note transcription with the patient, who gave verbal consent to proceed.  History of Present Illness Jenny Morales is a 53 year old female who presents for a new patient visit and medication management. She is accompanied by her wife, Jenny Morales, who is also a patient at the clinic.  She is currently taking amlodipine  10 mg daily for hypertension and hydroxyzine  as needed for anxiety. She has not started Wegovy  for weight loss due to insurance coverage issues.  She experiences hot flashes and night sweats since her surgery, which induced menopause. She has discussed these symptoms with her gynecologist, who offered hormonal treatments, but she declined due to anxiety about medications. She has tried non-prescription methods to manage these symptoms.  She has generalized anxiety disorder, exacerbated by the loss of her mother and two brothers in 2018. She experiences anxiety around medications due to these events.  She has been informed of prediabetes with an elevated A1c. Her activity has been limited due to plantar fasciitis and a potential stress fracture in  her right foot. She is taking vitamin D  supplements, approximately 2500 IU.  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 She also has a history of multiple myeloma.  She has a history of smoking with a 23 pack-year history, having quit in 2011. She has not undergone a screening CT scan for lung cancer.  Past Medical History:  Diagnosis Date   Allergy    Seasonal, pollen   Anxiety    generalized anxiety disorder with a history of panic attatcks   Arthritis    knees   Heart murmur    Per pt, heart mumrmur was from having rheumatic fever as a child. Pt was on PCN for years. In 67 's, pt stated that she no longer had a heart murmur and was taken off the PCN.   History of palpitations 11/30/2016   normal EKG, MD note in Epic  states suspected underlying anxiety   Hypertension 01/11/2021   Insomnia 2022   Obesity    Wears glasses      Family History  Problem Relation Age of Onset   Multiple myeloma Mother 43       died at age 44   Cancer Mother    Drug abuse Father        died at age 8. also had cancer    Cancer Father        unknown kind    Brain cancer Other        neice   Breast  cancer Neg Hx    Colon cancer Neg Hx    Colon polyps Neg Hx    Esophageal cancer Neg Hx    Stomach cancer Neg Hx    Rectal cancer Neg Hx    Cancer - Colon Neg Hx    Ovarian cancer Neg Hx    Endometrial cancer Neg Hx    Pancreatic cancer Neg Hx    Prostate cancer Neg Hx      Current Outpatient Medications:    amLODipine  (NORVASC ) 10 MG tablet, Take 1 tablet (10 mg total) by mouth daily., Disp: 90 tablet, Rfl: 3   Cholecalciferol (VITAMIN D3) 125 MCG (5000 UT) CAPS, Take by mouth., Disp: , Rfl:    Diclofenac  Sodium 1 % CREA, Apply 4 times daily to affected area, Disp: 120 g, Rfl: 3   Ginkgo Biloba 40 MG TABS, Take 1 tablet by mouth daily., Disp: , Rfl:    hydrOXYzine  (ATARAX ) 25 MG tablet, Take 1 tablet  (25 mg total) by mouth at bedtime as needed for anxiety., Disp: 90 tablet, Rfl: 3   Omega-3 Fatty Acids (FISH OIL) 1000 MG CAPS, Take by mouth., Disp: , Rfl:    No Known Allergies   Review of Systems  Constitutional: Negative.   Respiratory: Negative.    Cardiovascular: Negative.   Gastrointestinal: Negative.   Genitourinary:        Hot flashes/night sweats  Neurological: Negative.   Psychiatric/Behavioral: Negative.       Today's Vitals   06/20/23 1123  BP: 124/80  Pulse: 82  Temp: 98.1 F (36.7 C)  SpO2: 98%  Weight: 282 lb 6.4 oz (128.1 kg)  Height: 5\' 5"  (1.651 m)   Body mass index is 46.99 kg/m.  Wt Readings from Last 3 Encounters:  06/20/23 282 lb 6.4 oz (128.1 kg)  06/03/23 270 lb (122.5 kg)  03/11/23 262 lb (118.8 kg)    The 10-year ASCVD risk score (Arnett DK, et al., 2019) is: 3.3%   Values used to calculate the score:     Age: 61 years     Sex: Female     Is Non-Hispanic African American: Yes     Diabetic: No     Tobacco smoker: No     Systolic Blood Pressure: 124 mmHg     Is BP treated: Yes     HDL Cholesterol: 59 mg/dL     Total Cholesterol: 201 mg/dL  Objective:  Physical Exam Vitals and nursing note reviewed.  Constitutional:      Appearance: Normal appearance. She is obese.  HENT:     Head: Normocephalic and atraumatic.  Eyes:     Extraocular Movements: Extraocular movements intact.  Cardiovascular:     Rate and Rhythm: Normal rate and regular rhythm.     Heart sounds: Normal heart sounds.  Pulmonary:     Effort: Pulmonary effort is normal.     Breath sounds: Normal breath sounds.  Musculoskeletal:     Cervical back: Normal range of motion.  Skin:    General: Skin is warm.  Neurological:     General: No focal deficit present.     Mental Status: She is alert.  Psychiatric:        Mood and Affect: Mood normal.        Behavior: Behavior normal.         Assessment And Plan:  Primary hypertension Assessment & Plan: Chronic  hypertension managed with amlodipine . Risk factors include African American ethnicity and postmenopausal status. - Continue  amlodipine  10 mg daily. - Advise reducing sodium intake.  Orders: -     CBC -     CMP14+EGFR -     Hemoglobin A1c  Prediabetes Assessment & Plan: Elevated A1c indicating prediabetes. Lifestyle modifications recommended to prevent progression. - Encourage continued exercise and healthy diet. - Order blood tests for liver and kidney function, and prediabetes assessment.  Orders: -     CMP14+EGFR -     Hemoglobin A1c  Generalized anxiety disorder Assessment & Plan: Anxiety contributes to hesitancy in taking new medications. Magnesium glycinate recommended for anxiety and potential menopausal symptoms. - Continue hydroxyzine  as needed. - Recommend magnesium glycinate.  Orders: -     TSH  Plantar fasciitis of right foot Assessment & Plan: Plantar fasciitis affecting exercise ability. Managed with a boot and medication. - Perform calf stretches and use a golf ball for foot massage. - Request records from [facility]. - Follow up with foot specialist on April 23.   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 Prefers non-hormonal management. - Continue using Enbrel wave for hot flashes. - Consider magnesium glycinate - May benefit from Remifemin or Estroven if her sx persist.    Class 3 severe obesity due to excess calories without serious comorbidity with body mass index (BMI) of 40.0 to 44.9 in adult [facility]) Assessment & Plan: Obesity with interest in weight loss. Discussed potential use of GLP-1 agonists, insurance coverage, and potential sleep apnea diagnosis. - Check with insurance for coverage of Wegovy  or Zepbound. - Consider sleep study if insurance covers Zepbound. - Refer to [facility] for meal planning if needed.   Encounter to  establish care with new doctor  Return in 5 months (on 11/20/2023), or physical exam.  Patient was given opportunity to ask questions. Patient verbalized understanding of the plan and was able to repeat key elements of the plan. All questions were answered to their satisfaction.    I, Smiley Dung, MD, have reviewed all documentation for this visit. The documentation on 06/20/23 for the exam, diagnosis, procedures, and orders are all accurate and complete.   IF YOU HAVE BEEN REFERRED TO A SPECIALIST, IT MAY TAKE 1-2 WEEKS TO SCHEDULE/PROCESS THE REFERRAL. IF YOU HAVE NOT HEARD FROM US /SPECIALIST IN TWO WEEKS, PLEASE GIVE US  A CALL AT [phone] X [phone].   THE PATIENT IS ENCOURAGED TO PRACTICE SOCIAL DISTANCING DUE TO THE COVID-19 PANDEMIC.

## 2023-06-20 NOTE — Patient Instructions (Addendum)
 Wegovy/Zepbound - obesity Zepbound - sleep apnea=  Magnesium glycinate - Pure Encapsulations  Coronary Calcium Scan A coronary calcium scan is an imaging test used to look for deposits of plaque in the inner lining of the blood vessels of the heart (coronary arteries). Plaque is made up of calcium, protein, and fatty substances. These deposits of plaque can partly clog and narrow the coronary arteries without producing any symptoms or warning signs. This puts a person at risk for a heart attack. A coronary calcium scan is performed using a computed tomography (CT) scanner machine without using a dye (contrast). This test is recommended for people who are at moderate risk for heart disease. The test can find plaque deposits before symptoms develop. Tell a health care provider about: Any allergies you have. All medicines you are taking, including vitamins, herbs, eye drops, creams, and over-the-counter medicines. Any problems you or family members have had with anesthetic medicines. Any bleeding problems you have. Any surgeries you have had. Any medical conditions you have. Whether you are pregnant or may be pregnant. What are the risks? Generally, this is a safe procedure. However, problems may occur, including: Harm to a pregnant woman and her unborn baby. This test involves the use of radiation. Radiation exposure can be dangerous to a pregnant woman and her unborn baby. If you are pregnant or think you may be pregnant, you should not have this procedure done. A slight increase in the risk of cancer. This is because of the radiation involved in the test. The amount of radiation from one test is similar to the amount of radiation you are naturally exposed to over one year. What happens before the procedure? Ask your health care provider for any specific instructions on how to prepare for this procedure. You may be asked to avoid products that contain caffeine, tobacco, or nicotine for 4 hours  before the procedure. What happens during the procedure?  You will undress and remove any jewelry from your neck or chest. You may need to remove hearing aides and dentures. Women may need to remove their bras. You will put on a hospital gown. Sticky electrodes will be placed on your chest. The electrodes will be connected to an electrocardiogram (ECG) machine to record a tracing of the electrical activity of your heart. You will lie down on your back on a curved bed that is attached to the CT scanner. You may be given medicine to slow down your heart rate so that clear pictures can be created. You will be moved into the CT scanner, and the CT scanner will take pictures of your heart. During this time, you will be asked to lie still and hold your breath for 10-20 seconds at a time while each picture of your heart is being taken. The procedure may vary among health care providers and hospitals. What can I expect after the procedure? You can return to your normal activities. It is up to you to get the results of your procedure. Ask your health care provider, or the department that is doing the procedure, when your results will be ready. Summary A coronary calcium scan is an imaging test used to look for deposits of plaque in the inner lining of the blood vessels of the heart. Plaque is made up of calcium, protein, and fatty substances. A coronary calcium scan is performed using a CT scanner machine without contrast. Generally, this is a safe procedure. Tell your health care provider if you are pregnant or may  be pregnant. Ask your health care provider for any specific instructions on how to prepare for this procedure. You can return to your normal activities after the scan is done. This information is not intended to replace advice given to you by your health care provider. Make sure you discuss any questions you have with your health care provider. Document Revised: 02/05/2021 Document Reviewed:  02/05/2021 Elsevier Patient Education  2024 [employer].

## 2023-06-21 ENCOUNTER — Encounter: Payer: Self-pay | Admitting: Internal Medicine

## 2023-06-21 LAB — CMP14+EGFR
ALT: 22 IU/L (ref 0–32)
AST: 17 IU/L (ref 0–40)
Albumin: 4.4 g/dL (ref 3.8–4.9)
Alkaline Phosphatase: 79 IU/L (ref 44–121)
BUN/Creatinine Ratio: 20 (ref 9–23)
BUN: 14 mg/dL (ref 6–24)
Bilirubin Total: 0.2 mg/dL (ref 0.0–1.2)
CO2: 24 mmol/L (ref 20–29)
Calcium: 10 mg/dL (ref 8.7–10.2)
Chloride: 101 mmol/L (ref 96–106)
Creatinine, Ser: 0.7 mg/dL (ref 0.57–1.00)
Globulin, Total: 3 g/dL (ref 1.5–4.5)
Glucose: 96 mg/dL (ref 70–99)
Potassium: 4.7 mmol/L (ref 3.5–5.2)
Sodium: 141 mmol/L (ref 134–144)
Total Protein: 7.4 g/dL (ref 6.0–8.5)
eGFR: 104 mL/min/{1.73_m2} (ref >59)

## 2023-06-21 LAB — HEMOGLOBIN A1C
Est. average glucose Bld gHb Est-mCnc: 123 mg/dL
Hgb A1c MFr Bld: 5.9 % — ABNORMAL HIGH (ref 4.8–5.6)

## 2023-06-21 LAB — CBC
Hematocrit: 41.3 % (ref 34.0–46.6)
Hemoglobin: 13.7 g/dL (ref 11.1–15.9)
MCH: 26.6 pg (ref 26.6–33.0)
MCHC: 33.2 g/dL (ref 31.5–35.7)
MCV: 80 fL (ref 79–97)
Platelets: 289 10*3/uL (ref 150–450)
RBC: 5.16 x10E6/uL (ref 3.77–5.28)
RDW: 14.7 % (ref 11.7–15.4)
WBC: 7.2 10*3/uL (ref 3.4–10.8)

## 2023-06-21 LAB — TSH: TSH: 1.34 u[IU]/mL (ref 0.450–4.500)

## 2023-06-30 DIAGNOSIS — M722 Plantar fascial fibromatosis: Secondary | ICD-10-CM | POA: Insufficient documentation

## 2023-06-30 DIAGNOSIS — N951 Menopausal and female climacteric states: Secondary | ICD-10-CM | POA: Insufficient documentation

## 2023-06-30 NOTE — Assessment & Plan Note (Signed)
 Elevated A1c indicating prediabetes. Lifestyle modifications recommended to prevent progression. - Encourage continued exercise and healthy diet. - Order blood tests for liver and kidney function, and prediabetes assessment.

## 2023-06-30 NOTE — Assessment & Plan Note (Signed)
 Plantar fasciitis affecting exercise ability. Managed with a boot and medication. - Perform calf stretches and use a golf ball for foot massage. - Request records from [facility]. - Follow up with foot specialist on April 23.

## 2023-06-30 NOTE — Assessment & Plan Note (Signed)
 Anxiety contributes to hesitancy in taking new medications. Magnesium glycinate recommended for anxiety and potential menopausal symptoms. - Continue hydroxyzine  as needed. - Recommend magnesium glycinate.

## 2023-06-30 NOTE — Assessment & Plan Note (Signed)
 Experiencing hot flashes and night sweats post-hysterectomy. Prefers non-hormonal management. - Continue using Enbrel wave for hot flashes. - Consider magnesium glycinate - May benefit from Remifemin or Estroven if her sx persist.

## 2023-06-30 NOTE — Assessment & Plan Note (Signed)
 Chronic hypertension managed with amlodipine . Risk factors include African American ethnicity and postmenopausal status. - Continue amlodipine  10 mg daily. - Advise reducing sodium intake.

## 2023-06-30 NOTE — Assessment & Plan Note (Signed)
 Obesity with interest in weight loss. Discussed potential use of GLP-1 agonists, insurance coverage, and potential sleep apnea diagnosis. - Check with insurance for coverage of Wegovy  or Zepbound. - Consider sleep study if insurance covers Zepbound. - Refer to [facility] for meal planning if needed.

## 2023-07-01 ENCOUNTER — Other Ambulatory Visit: Payer: Self-pay | Admitting: Internal Medicine

## 2023-07-01 ENCOUNTER — Other Ambulatory Visit: Payer: Self-pay

## 2023-07-01 MED ORDER — TIRZEPATIDE-WEIGHT MANAGEMENT 5 MG/0.5ML ~~LOC~~ SOLN: 5.0000 mg | SUBCUTANEOUS | 1 refills | Status: DC

## 2023-07-01 MED ORDER — WEGOVY 0.5 MG/0.5ML ~~LOC~~ SOAJ: 0.5000 mg | SUBCUTANEOUS | 0 refills | Status: DC

## 2023-07-02 ENCOUNTER — Ambulatory Visit

## 2023-07-02 VITALS — BP 124/80 | HR 74 | Temp 98.0°F | Ht 65.0 in | Wt 282.0 lb

## 2023-07-02 DIAGNOSIS — E66813 Obesity, class 3: Secondary | ICD-10-CM

## 2023-07-02 NOTE — Patient Instructions (Signed)

## 2023-07-02 NOTE — Progress Notes (Signed)
 Patient presents today for Wegovy  teaching. She will start off with a sample today. Patient given first injection today. She will inject on Tuesday every week of 0.25mg  dose. Then after 4 weeks go up to 0.5mg  dose which will be mailed.

## 2023-08-20 ENCOUNTER — Other Ambulatory Visit: Payer: Self-pay | Admitting: Internal Medicine

## 2023-08-20 ENCOUNTER — Telehealth: Payer: Self-pay | Admitting: Internal Medicine

## 2023-08-20 ENCOUNTER — Other Ambulatory Visit: Payer: Self-pay

## 2023-08-20 MED ORDER — WEGOVY 1 MG/0.5ML ~~LOC~~ SOAJ: 1.0000 mg | SUBCUTANEOUS | 0 refills | Status: DC

## 2023-08-20 NOTE — Telephone Encounter (Unsigned)
 Copied from CRM [id number]. Topic: Clinical - Medication Refill >> Aug 20, 2023  8:18 AM Zipporah Him wrote: Medication: Semaglutide -Weight Management (WEGOVY ) 0.5 MG/0.5ML SOAJ  Has the patient contacted their pharmacy? Yes   This is the patient's preferred pharmacy:  [facility]   Is this the correct pharmacy for this prescription? Yes If no, delete pharmacy and type the correct one.   Has the prescription been filled recently? No  Is the patient out of the medication? Yes  Has the patient been seen for an appointment in the last year OR does the patient have an upcoming appointment? Yes  Can we respond through MyChart? Yes  Agent: Please be advised that Rx refills may take up to 3 business days. We ask that you follow-up with your pharmacy. >> Aug 20, 2023  8:20 AM Zipporah Him wrote: Patient states she needs a refill on her wegovy  but thinks shed supposed to go up in mg. Please advise.

## 2023-08-20 NOTE — Telephone Encounter (Unsigned)
 Copied from CRM [id number]. Topic: Clinical - Medication Refill >> Aug 20, 2023  8:18 AM Zipporah Him wrote: Medication: Semaglutide -Weight Management (WEGOVY ) 0.5 MG/0.5ML SOAJ  Has the patient contacted their pharmacy? Yes   This is the patient's preferred pharmacy:  [facility]   Is this the correct pharmacy for this prescription? Yes If no, delete pharmacy and type the correct one.   Has the prescription been filled recently? No  Is the patient out of the medication? Yes  Has the patient been seen for an appointment in the last year OR does the patient have an upcoming appointment? Yes  Can we respond through MyChart? Yes  Agent: Please be advised that Rx refills may take up to 3 business days. We ask that you follow-up with your pharmacy.

## 2023-08-22 MED ORDER — WEGOVY 0.5 MG/0.5ML ~~LOC~~ SOAJ: 0.5000 mg | SUBCUTANEOUS | 0 refills | Status: DC

## 2023-09-11 ENCOUNTER — Ambulatory Visit: Payer: Self-pay | Admitting: Internal Medicine

## 2023-09-11 ENCOUNTER — Encounter: Payer: Self-pay | Admitting: Internal Medicine

## 2023-09-11 VITALS — BP 126/82 | HR 71 | Temp 98.8°F | Ht 65.0 in | Wt 270.0 lb

## 2023-09-11 DIAGNOSIS — I10 Essential (primary) hypertension: Secondary | ICD-10-CM

## 2023-09-11 DIAGNOSIS — N951 Menopausal and female climacteric states: Secondary | ICD-10-CM | POA: Diagnosis not present

## 2023-09-11 DIAGNOSIS — Z6841 Body Mass Index (BMI) 40.0 and over, adult: Secondary | ICD-10-CM

## 2023-09-11 DIAGNOSIS — R7303 Prediabetes: Secondary | ICD-10-CM

## 2023-09-11 DIAGNOSIS — E66813 Obesity, class 3: Secondary | ICD-10-CM

## 2023-09-11 DIAGNOSIS — D3912 Neoplasm of uncertain behavior of left ovary: Secondary | ICD-10-CM

## 2023-09-11 MED ORDER — WEGOVY 1.7 MG/0.75ML ~~LOC~~ SOAJ: 1.7000 mg | SUBCUTANEOUS | 1 refills | Status: DC

## 2023-09-11 NOTE — Patient Instructions (Signed)
 Hypertension, Adult Hypertension is another name for high blood pressure. High blood pressure forces your heart to work harder to pump blood. This can cause problems over time. There are two numbers in a blood pressure reading. There is a top number (systolic) over a bottom number (diastolic). It is best to have a blood pressure that is below 120/80. What are the causes? The cause of this condition is not known. Some other conditions can lead to high blood pressure. What increases the risk? Some lifestyle factors can make you more likely to develop high blood pressure: Smoking. Not getting enough exercise or physical activity. Being overweight. Having too much fat, sugar, calories, or salt (sodium) in your diet. Drinking too much alcohol. Other risk factors include: Having any of these conditions: Heart disease. Diabetes. High cholesterol. Kidney disease. Obstructive sleep apnea. Having a family history of high blood pressure and high cholesterol. Age. The risk increases with age. Stress. What are the signs or symptoms? High blood pressure may not cause symptoms. Very high blood pressure (hypertensive crisis) may cause: Headache. Fast or uneven heartbeats (palpitations). Shortness of breath. Nosebleed. Vomiting or feeling like you may vomit (nauseous). Changes in how you see. Very bad chest pain. Feeling dizzy. Seizures. How is this treated? This condition is treated by making healthy lifestyle changes, such as: Eating healthy foods. Exercising more. Drinking less alcohol. Your doctor may prescribe medicine if lifestyle changes do not help enough and if: Your top number is above 130. Your bottom number is above 80. Your personal target blood pressure may vary. Follow these instructions at home: Eating and drinking  If told, follow the DASH eating plan. To follow this plan: Fill one half of your plate at each meal with fruits and vegetables. Fill one fourth of your plate  at each meal with whole grains. Whole grains include whole-wheat pasta, brown rice, and whole-grain bread. Eat or drink low-fat dairy products, such as skim milk or low-fat yogurt. Fill one fourth of your plate at each meal with low-fat (lean) proteins. Low-fat proteins include fish, chicken without skin, eggs, beans, and tofu. Avoid fatty meat, cured and processed meat, or chicken with skin. Avoid pre-made or processed food. Limit the amount of salt in your diet to less than 1,500 mg each day. Do not drink alcohol if: Your doctor tells you not to drink. You are pregnant, may be pregnant, or are planning to become pregnant. If you drink alcohol: Limit how much you have to: 0-1 drink a day for women. 0-2 drinks a day for men. Know how much alcohol is in your drink. In the [place] one drink equals one 12 oz bottle of beer (355 mL), one 5 oz glass of wine (148 mL), or one 1 oz glass of hard liquor (44 mL). Lifestyle  Work with your doctor to stay at a healthy weight or to lose weight. Ask your doctor what the best weight is for you. Get at least 30 minutes of exercise that causes your heart to beat faster (aerobic exercise) most days of the week. This may include walking, swimming, or biking. Get at least 30 minutes of exercise that strengthens your muscles (resistance exercise) at least 3 days a week. This may include lifting weights or doing Pilates. Do not smoke or use any products that contain nicotine or tobacco. If you need help quitting, ask your doctor. Check your blood pressure at home as told by your doctor. Keep all follow-up visits. Medicines Take over-the-counter and prescription medicines  only as told by your doctor. Follow directions carefully. Do not skip doses of blood pressure medicine. The medicine does not work as well if you skip doses. Skipping doses also puts you at risk for problems. Ask your doctor about side effects or reactions to medicines that you should watch  for. Contact a doctor if: You think you are having a reaction to the medicine you are taking. You have headaches that keep coming back. You feel dizzy. You have swelling in your ankles. You have trouble with your vision. Get help right away if: You get a very bad headache. You start to feel mixed up (confused). You feel weak or numb. You feel faint. You have very bad pain in your: Chest. Belly (abdomen). You vomit more than once. You have trouble breathing. These symptoms may be an emergency. Get help right away. Call 911. Do not wait to see if the symptoms will go away. Do not drive yourself to the hospital. Summary Hypertension is another name for high blood pressure. High blood pressure forces your heart to work harder to pump blood. For most people, a normal blood pressure is less than 120/80. Making healthy choices can help lower blood pressure. If your blood pressure does not get lower with healthy choices, you may need to take medicine. This information is not intended to replace advice given to you by your health care provider. Make sure you discuss any questions you have with your health care provider. Document Revised: 12/15/2020 Document Reviewed: 12/15/2020 Elsevier Patient Education  2024 [employer].

## 2023-09-11 NOTE — Progress Notes (Signed)
 I,Victoria T Emmitt, CMA,acting as a Neurosurgeon for Catheryn LOISE Slocumb, MD.,have documented all relevant documentation on the behalf of Catheryn LOISE Slocumb, MD,as directed by  Catheryn LOISE Slocumb, MD while in the presence of Catheryn LOISE Slocumb, MD.  Subjective:  Patient ID: Jenny Morales , female    DOB: 1970-05-24 , 53 y.o.   MRN: [id number]  Chief Complaint  Patient presents with   Hypertension    Patient presents today for bp, prediabetes. She reports compliance with medications. Denies headache, chest pain & sob. She reports noticing ow bp readings in the 120/70 range. She believes this is  low for her. She wants to come off the Amlodipine  due to this.    Prediabetes    HPI Discussed the use of AI scribe software for clinical note transcription with the patient, who gave verbal consent to proceed.  History of Present Illness Jenny Morales is a 53 year old female who presents for follow-up on her Wegovy  treatment.  She has been on Wegovy  1.0 mg for three weeks, with one pen remaining. The medication decreases her appetite but causes fatigue, especially after the first dose of a new pen. She is considering changing the administration time to nighttime to manage this side effect.  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 Post-surgery, she undergoes CEA antigen tests every six months as a precaution, with the last test in May being normal. She also has a history of rheumatic heart disease in childhood, which resolved in adulthood.  She manages high cholesterol, previously reduced from 145 to 117, likely through dietary changes. She experiences menopause-related symptoms, including hot flashes, which are alleviated by magnesium glycinate. She was given samples of Veozah but opted not to take them due to concerns about medication load and liver monitoring.  She is currently taking amlodipine , recently refilled at [facility], and magnesium glycinate for sleep  improvement. No nausea or vomiting, but fatigue is noted as a side effect of Wegovy . Sleep has improved with magnesium glycinate.   Hypertension This is a chronic problem. The current episode started more than 1 year ago. Risk factors for coronary artery disease include obesity and post-menopausal state. Past treatments include calcium channel blockers. The current treatment provides moderate improvement. There are no compliance problems.      Past Medical History:  Diagnosis Date   Allergy    Seasonal, pollen   Anxiety    generalized anxiety disorder with a history of panic attatcks   Arthritis    knees   Heart murmur    Per pt, heart mumrmur was from having rheumatic fever as a child. Pt was on PCN for years. In 65 's, pt stated that she no longer had a heart murmur and was taken off the PCN.   History of palpitations 11/30/2016   normal EKG, MD note in Epic  states suspected underlying anxiety   Hypertension 01/11/2021   Insomnia 2022   Obesity    Wears glasses      Family History  Problem Relation Age of Onset   Multiple myeloma Mother 66       died at age 22   Cancer Mother    Drug abuse Father        died at age 25. also had cancer    Cancer Father        unknown kind    Brain cancer Other        neice  Breast cancer Neg Hx    Colon cancer Neg Hx    Colon polyps Neg Hx    Esophageal cancer Neg Hx    Stomach cancer Neg Hx    Rectal cancer Neg Hx    Cancer - Colon Neg Hx    Ovarian cancer Neg Hx    Endometrial cancer Neg Hx    Pancreatic cancer Neg Hx    Prostate cancer Neg Hx      Current Outpatient Medications:    amLODipine  (NORVASC ) 10 MG tablet, Take 1 tablet (10 mg total) by mouth daily., Disp: 90 tablet, Rfl: 3   Cholecalciferol (VITAMIN D3) 125 MCG (5000 UT) CAPS, Take by mouth., Disp: , Rfl:    Diclofenac  Sodium 1 % CREA, Apply 4 times daily to affected area, Disp: 120 g, Rfl: 3   Ginkgo Biloba 40 MG TABS, Take 1 tablet by mouth daily., Disp: , Rfl:     hydrOXYzine  (ATARAX ) 25 MG tablet, Take 1 tablet (25 mg total) by mouth at bedtime as needed for anxiety., Disp: 90 tablet, Rfl: 3   Omega-3 Fatty Acids (FISH OIL) 1000 MG CAPS, Take by mouth., Disp: , Rfl:    Semaglutide -Weight Management (WEGOVY ) 1.7 MG/0.75ML SOAJ, Inject 1.7 mg into the skin once a week., Disp: 3 mL, Rfl: 1   No Known Allergies   Review of Systems  Constitutional: Negative.   Respiratory: Negative.    Cardiovascular: Negative.   Neurological: Negative.   Psychiatric/Behavioral: Negative.       Today's Vitals   09/11/23 1517  BP: 126/82  Pulse: 71  Temp: 98.8 F (37.1 C)  SpO2: 98%  Weight: 270 lb (122.5 kg)  Height: 5' 5 (1.651 m)   Body mass index is 44.93 kg/m.  Wt Readings from Last 3 Encounters:  09/11/23 270 lb (122.5 kg)  07/02/23 282 lb (127.9 kg)  06/20/23 282 lb 6.4 oz (128.1 kg)    The 10-year ASCVD risk score (Arnett DK, et al., 2019) is: 3.8%   Values used to calculate the score:     Age: 52 years     Clincally relevant sex: Female     Is Non-Hispanic African American: Yes     Diabetic: No     Tobacco smoker: No     Systolic Blood Pressure: 126 mmHg     Is BP treated: Yes     HDL Cholesterol: 59 mg/dL     Total Cholesterol: 201 mg/dL  Objective:  Physical Exam Vitals and nursing note reviewed.  Constitutional:      Appearance: Normal appearance. She is obese.  HENT:     Head: Normocephalic and atraumatic.  Eyes:     Extraocular Movements: Extraocular movements intact.  Cardiovascular:     Rate and Rhythm: Normal rate and regular rhythm.     Heart sounds: Normal heart sounds.  Pulmonary:     Effort: Pulmonary effort is normal.     Breath sounds: Normal breath sounds.  Musculoskeletal:     Cervical back: Normal range of motion.  Skin:    General: Skin is warm.  Neurological:     General: No focal deficit present.     Mental Status: She is alert.  Psychiatric:        Mood and Affect: Mood normal.        Behavior:  Behavior normal.         Assessment And Plan:  Class 3 severe obesity due to excess calories without serious comorbidity with body mass  index (BMI) of 40.0 to 44.9 in adult Assessment & Plan: BMI 44.  On Wegovy  1.0 mg with 10-12 lb weight loss. Reports decreased appetite and fatigue. Opted to increase to 1.7 mg. - Increase Wegovy  to 1.7 mg with a refill for two months. - Advise switching Wegovy  administration to nighttime. - Continue strength training and ensure adequate protein intake. - Follow up in 8-10 weeks.   Primary hypertension Assessment & Plan: Chronic hypertension managed with amlodipine . Risk factors include African American ethnicity and postmenopausal status. - Continue amlodipine  10 mg daily. - Advise reducing sodium intake.   Ovarian tumor of borderline malignancy, left Assessment & Plan: Post-hysterectomy, cleared by oncologist. Monitors with CEA tests every six months. - Ensure CEA test is checked every six months.   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 Magnesium glycinate improved sleep. Declined Veozah due to liver monitoring concerns. - Continue magnesium glycinate for sleep improvement.   Other orders -     Wegovy ; Inject 1.7 mg into the skin once a week.  Dispense: 3 mL; Refill: 1   Return if symptoms worsen or fail to improve.  Patient was given opportunity to ask questions. Patient verbalized understanding of the plan and was able to repeat key elements of the plan. All questions were answered to their satisfaction.   I, Catheryn LOISE Slocumb, MD, have reviewed all documentation for this visit. The documentation on 09/11/23 for the exam, diagnosis, procedures, and orders are all accurate and complete.   IF YOU HAVE BEEN REFERRED TO A SPECIALIST, IT MAY TAKE 1-2 WEEKS TO SCHEDULE/PROCESS THE REFERRAL. IF YOU HAVE NOT HEARD FROM US /SPECIALIST IN TWO WEEKS, PLEASE GIVE US  A  CALL AT [phone] X [phone].   THE PATIENT IS ENCOURAGED TO PRACTICE SOCIAL DISTANCING DUE TO THE COVID-19 PANDEMIC.

## 2023-09-16 ENCOUNTER — Encounter: Payer: Self-pay | Admitting: Internal Medicine

## 2023-09-16 NOTE — Assessment & Plan Note (Signed)
 Chronic hypertension managed with amlodipine . Risk factors include African American ethnicity and postmenopausal status. - Continue amlodipine  10 mg daily. - Advise reducing sodium intake.

## 2023-09-16 NOTE — Assessment & Plan Note (Signed)
 BMI 44.  On Wegovy  1.0 mg with 10-12 lb weight loss. Reports decreased appetite and fatigue. Opted to increase to 1.7 mg. - Increase Wegovy  to 1.7 mg with a refill for two months. - Advise switching Wegovy  administration to nighttime. - Continue strength training and ensure adequate protein intake. - Follow up in 8-10 weeks.

## 2023-09-16 NOTE — Assessment & Plan Note (Signed)
 Experiences sleep disturbances and hot flashes. Magnesium glycinate improved sleep. Declined Veozah due to liver monitoring concerns. - Continue magnesium glycinate for sleep improvement.

## 2023-09-16 NOTE — Assessment & Plan Note (Signed)
 Post-hysterectomy, cleared by oncologist. Monitors with CEA tests every six months. - Ensure CEA test is checked every six months.

## 2023-10-28 ENCOUNTER — Other Ambulatory Visit: Payer: Self-pay

## 2023-10-28 ENCOUNTER — Encounter: Payer: Self-pay | Admitting: Internal Medicine

## 2023-10-28 DIAGNOSIS — R030 Elevated blood-pressure reading, without diagnosis of hypertension: Secondary | ICD-10-CM

## 2023-10-28 MED ORDER — AMLODIPINE BESYLATE 10 MG PO TABS: 10.0000 mg | ORAL_TABLET | Freq: Every day | ORAL | 3 refills | Status: AC

## 2023-11-20 ENCOUNTER — Encounter: Payer: Self-pay | Admitting: Internal Medicine

## 2023-11-20 ENCOUNTER — Ambulatory Visit (INDEPENDENT_AMBULATORY_CARE_PROVIDER_SITE_OTHER): Admitting: Internal Medicine

## 2023-11-20 VITALS — BP 122/84 | HR 76 | Temp 98.7°F | Ht 65.0 in | Wt 258.6 lb

## 2023-11-20 DIAGNOSIS — E66813 Obesity, class 3: Secondary | ICD-10-CM

## 2023-11-20 DIAGNOSIS — D3912 Neoplasm of uncertain behavior of left ovary: Secondary | ICD-10-CM

## 2023-11-20 DIAGNOSIS — F5101 Primary insomnia: Secondary | ICD-10-CM

## 2023-11-20 DIAGNOSIS — R7303 Prediabetes: Secondary | ICD-10-CM

## 2023-11-20 DIAGNOSIS — I10 Essential (primary) hypertension: Secondary | ICD-10-CM | POA: Diagnosis not present

## 2023-11-20 DIAGNOSIS — Z0000 Encounter for general adult medical examination without abnormal findings: Secondary | ICD-10-CM

## 2023-11-20 DIAGNOSIS — F411 Generalized anxiety disorder: Secondary | ICD-10-CM

## 2023-11-20 DIAGNOSIS — Z6841 Body Mass Index (BMI) 40.0 and over, adult: Secondary | ICD-10-CM

## 2023-11-20 LAB — POCT URINALYSIS DIP (CLINITEK)
Bilirubin, UA: NEGATIVE
Blood, UA: NEGATIVE
Glucose, UA: NEGATIVE mg/dL
Ketones, POC UA: NEGATIVE mg/dL
Leukocytes, UA: NEGATIVE
Nitrite, UA: NEGATIVE
Spec Grav, UA: 1.02 (ref 1.010–1.025)
Urobilinogen, UA: 0.2 U/dL
pH, UA: 7 (ref 5.0–8.0)

## 2023-11-20 MED ORDER — WEGOVY 2.4 MG/0.75ML ~~LOC~~ SOAJ: 2.4000 mg | SUBCUTANEOUS | 3 refills | Status: DC

## 2023-11-20 NOTE — Assessment & Plan Note (Signed)

## 2023-11-20 NOTE — Patient Instructions (Signed)

## 2023-11-20 NOTE — Progress Notes (Unsigned)
 I,Jenny Morales, CMA,acting as a Neurosurgeon for Jenny LOISE Slocumb, MD.,have documented all relevant documentation on the behalf of Jenny LOISE Slocumb, MD,as directed by  Jenny LOISE Slocumb, MD while in the presence of Jenny LOISE Slocumb, MD.  Subjective:    Patient ID: Jenny Morales , female    DOB: 03-06-1971 , 53 y.o.   MRN: [id number]  Chief Complaint  Patient presents with  . Annual Exam    Patient presents today for annual exam. She reports compliance with medications. Denies headache, chest pain & sob. GYN: Dr Jesus Colonoscopy sch for December.   . Hypertension  . Prediabetes    HPI Discussed the use of AI scribe software for clinical note transcription with the patient, who gave verbal consent to proceed.  History of Present Illness Jenny Morales is a 53 year old female who presents for a physical and blood pressure check.  She is currently taking amlodipine  10 mg daily for hypertension. Her blood pressure at today's visit was 122/84 mmHg. She wants to gradually wean off the medication.  She is on Wegovy  1.7 mg for weight management, resulting in a total weight loss of 24 pounds since July. She has not taken Wegovy  this week due to a misunderstanding about needing a visit before a refill.  Her medication list includes hydroxyzine , which she takes as needed for sleep issues related to anxiety, though she has not used it recently. She attributes her current sleep disturbances to hot flashes rather than anxiety.  She engages in regular physical activity, including strength training twice a week and cardio three times a week. She is working towards a goal weight of 220 pounds.  She has a history of high blood pressure and has undergone eye exams to monitor for related complications. 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 She plans to follow up with the specialist again this year.   HPI   Past  Medical History:  Diagnosis Date  . Allergy    Seasonal, pollen  . Anxiety    generalized anxiety disorder with a history of panic attatcks  . Arthritis    knees  . Heart murmur    Per pt, heart mumrmur was from having rheumatic fever as a child. Pt was on PCN for years. In 60 's, pt stated that she no longer had a heart murmur and was taken off the PCN.  SABRA History of palpitations 11/30/2016   normal EKG, MD note in Epic  states suspected underlying anxiety  . Hypertension 01/11/2021  . Insomnia 2022  . Obesity   . Wears glasses      Family History  Problem Relation Age of Onset  . Multiple myeloma Mother 71       died at age 75  . Cancer Mother   . Drug abuse Father        died at age 82. also had cancer   . Cancer Father        unknown kind   . Brain cancer Other        neice  . Breast cancer Neg Hx   . Colon cancer Neg Hx   . Colon polyps Neg Hx   . Esophageal cancer Neg Hx   . Stomach cancer Neg Hx   . Rectal cancer Neg Hx   . Cancer - Colon Neg Hx   . Ovarian cancer Neg Hx   . Endometrial  cancer Neg Hx   . Pancreatic cancer Neg Hx   . Prostate cancer Neg Hx      Current Outpatient Medications:  .  amLODipine  (NORVASC ) 10 MG tablet, Take 1 tablet (10 mg total) by mouth daily., Disp: 90 tablet, Rfl: 3 .  Cholecalciferol (VITAMIN D3) 125 MCG (5000 UT) CAPS, Take by mouth., Disp: , Rfl:  .  Diclofenac  Sodium 1 % CREA, Apply 4 times daily to affected area, Disp: 120 g, Rfl: 3 .  Ginkgo Biloba 40 MG TABS, Take 1 tablet by mouth daily., Disp: , Rfl:  .  hydrOXYzine  (ATARAX ) 25 MG tablet, Take 1 tablet (25 mg total) by mouth at bedtime as needed for anxiety., Disp: 90 tablet, Rfl: 3 .  Omega-3 Fatty Acids (FISH OIL) 1000 MG CAPS, Take by mouth., Disp: , Rfl:  .  semaglutide -weight management (WEGOVY ) 2.4 MG/0.75ML SOAJ SQ injection, Inject 2.4 mg into the skin once a week., Disp: 3 mL, Rfl: 3   No Known Allergies    The patient states she uses post menopausal  status for birth control. Patient's last menstrual period was 06/07/2021 (exact date).. Negative for Dysmenorrhea. Negative for: breast discharge, breast lump(s), breast pain and breast self exam. Associated symptoms include abnormal vaginal bleeding. Pertinent negatives include abnormal bleeding (hematology), anxiety, decreased libido, depression, difficulty falling sleep, dyspareunia, history of infertility, nocturia, sexual dysfunction, sleep disturbances, urinary incontinence, urinary urgency, vaginal discharge and vaginal itching. Diet regular.The patient states her exercise level is    . The patient's tobacco use is:  Social History   Tobacco Use  Smoking Status Former  . Current packs/day: 0.00  . Average packs/day: 0.8 packs/day for 23.0 years (17.3 ttl pk-yrs)  . Types: Cigarettes  . Start date: 03/12/1986  . Quit date: 2011  . Years since quitting: 14.7  Smokeless Tobacco Never  . She has been exposed to passive smoke. The patient's alcohol use is:  Social History   Substance and Sexual Activity  Alcohol Use Yes  . Alcohol/week: 5.0 standard drinks of alcohol   Comment: 3 to 5 glasses of wine, occasional liquor    Review of Systems  Constitutional: Negative.   HENT: Negative.    Eyes: Negative.   Respiratory: Negative.    Cardiovascular: Negative.   Gastrointestinal: Negative.   Endocrine: Negative.   Genitourinary: Negative.   Musculoskeletal: Negative.   Skin: Negative.   Allergic/Immunologic: Negative.   Neurological: Negative.   Hematological: Negative.   Psychiatric/Behavioral: Negative.       Today's Vitals   11/20/23 0917  BP: 122/84  Pulse: 76  Temp: 98.7 F (37.1 C)  SpO2: 98%  Weight: 258 lb 9.6 oz (117.3 kg)  Height: 5' 5 (1.651 m)   Body mass index is 43.03 kg/m.      Objective:  Physical Exam Vitals and nursing note reviewed.  Constitutional:      Appearance: Normal appearance. She is obese.  HENT:     Head: Normocephalic and  atraumatic.     Right Ear: Tympanic membrane, ear canal and external ear normal. There is no impacted cerumen.     Left Ear: Tympanic membrane, ear canal and external ear normal. There is no impacted cerumen.     Mouth/Throat:     Pharynx: No oropharyngeal exudate or posterior oropharyngeal erythema.  Eyes:     Extraocular Movements: Extraocular movements intact.     Conjunctiva/sclera: Conjunctivae normal.     Pupils: Pupils are equal, round, and reactive to light.  Cardiovascular:  Rate and Rhythm: Normal rate and regular rhythm.     Pulses: Normal pulses.     Heart sounds: Normal heart sounds.  Pulmonary:     Effort: Pulmonary effort is normal.     Breath sounds: Normal breath sounds.  Chest:  Breasts:    Tanner Score is 5.     Right: Normal.     Left: Normal.  Abdominal:     General: Bowel sounds are normal.     Palpations: Abdomen is soft.  Genitourinary:    Comments: Deferred  Musculoskeletal:        General: No swelling or tenderness.     Cervical back: Normal range of motion.  Skin:    General: Skin is warm.  Neurological:     General: No focal deficit present.     Mental Status: She is alert.  Psychiatric:        Mood and Affect: Mood normal.        Behavior: Behavior normal.         Assessment And Plan:     Encounter for annual health examination -     CMP14+EGFR -     Lipid panel  Primary hypertension -     POCT URINALYSIS DIP (CLINITEK) -     Microalbumin / creatinine urine ratio -     EKG 12-Lead  Prediabetes -     Hemoglobin A1c  Ovarian tumor of borderline malignancy, left  Generalized anxiety disorder  Class 3 severe obesity due to excess calories without serious comorbidity with body mass index (BMI) of 40.0 to 44.9 in adult  Primary insomnia  Other orders -     Wegovy ; Inject 2.4 mg into the skin once a week.  Dispense: 3 mL; Refill: 3   Assessment & Plan Essential hypertension Blood pressure controlled at 122/84 mmHg. She  wishes to reduce medication. - Continue amlodipine  10 mg daily. - Monitor blood pressure for potential reduction in medication dosage when consistently in the low 100s.  Obesity, class 3 Lost 24 pounds since July. Wegovy  effective in appetite suppression. Goal weight 220 pounds. - Send prescription for Wegovy  2.4 mg to [facility]. - Continue strength training on Tuesdays and Thursdays. - Continue cardio exercises on Monday, Wednesday, and Friday.  Prediabetes Addressed with Wegovy , aiding weight loss and potential improvement in prediabetic status. - Perform prediabetes test. - Continue Wegovy  treatment.  Insomnia Insomnia due to hot flashes. Hydroxyzine  used as needed for sleep. - Send prescription for hydroxyzine  to pharmacy.     Return in 12 weeks (on 02/12/2024), or weight check, for 1 year HM . Patient was given opportunity to ask questions. Patient verbalized understanding of the plan and was able to repeat key elements of the plan. All questions were answered to their satisfaction.    I, Jenny LOISE Slocumb, MD, have reviewed all documentation for this visit. The documentation on 11/20/23 for the exam, diagnosis, procedures, and orders are all accurate and complete.

## 2023-11-21 LAB — CMP14+EGFR
ALT: 20 IU/L (ref 0–32)
AST: 20 IU/L (ref 0–40)
Albumin: 4.1 g/dL (ref 3.8–4.9)
Alkaline Phosphatase: 71 IU/L (ref 44–121)
BUN/Creatinine Ratio: 14 (ref 9–23)
BUN: 13 mg/dL (ref 6–24)
Bilirubin Total: 0.4 mg/dL (ref 0.0–1.2)
CO2: 24 mmol/L (ref 20–29)
Calcium: 9.9 mg/dL (ref 8.7–10.2)
Chloride: 102 mmol/L (ref 96–106)
Creatinine, Ser: 0.95 mg/dL (ref 0.57–1.00)
Globulin, Total: 3.1 g/dL (ref 1.5–4.5)
Glucose: 91 mg/dL (ref 70–99)
Potassium: 4.4 mmol/L (ref 3.5–5.2)
Sodium: 138 mmol/L (ref 134–144)
Total Protein: 7.2 g/dL (ref 6.0–8.5)
eGFR: 72 mL/min/1.73 (ref >59)

## 2023-11-21 LAB — HEMOGLOBIN A1C
Est. average glucose Bld gHb Est-mCnc: 111 mg/dL
Hgb A1c MFr Bld: 5.5 % (ref 4.8–5.6)

## 2023-11-21 LAB — LIPID PANEL
Chol/HDL Ratio: 3.5 ratio (ref 0.0–4.4)
Cholesterol, Total: 171 mg/dL (ref 100–199)
HDL: 49 mg/dL (ref >39)
LDL Chol Calc (NIH): 100 mg/dL — ABNORMAL HIGH (ref 0–99)
Triglycerides: 124 mg/dL (ref 0–149)
VLDL Cholesterol Cal: 22 mg/dL (ref 5–40)

## 2023-11-21 LAB — MICROALBUMIN / CREATININE URINE RATIO
Creatinine, Urine: 198.9 mg/dL
Microalb/Creat Ratio: 6 mg/g{creat} (ref 0–29)
Microalbumin, Urine: 11.7 ug/mL

## 2023-11-22 ENCOUNTER — Ambulatory Visit: Payer: Self-pay | Admitting: Internal Medicine

## 2023-11-24 NOTE — Assessment & Plan Note (Addendum)
 Blood pressure controlled at 122/84 mmHg. She wishes to reduce medication.  EKG performed, NSR w/o acute changes.  - Continue amlodipine  10 mg daily. - Monitor blood pressure for potential reduction in medication dosage when consistently in the low 100s. - Follow low sodium diet.

## 2023-11-24 NOTE — Assessment & Plan Note (Signed)
 Chronic, sx currently controlled. She will take hydroxyzine  prn.

## 2023-11-24 NOTE — Assessment & Plan Note (Addendum)
 Previous labs reviewed, her A1c has been elevated in the past. I will check an A1c today. Reminded to avoid refined sugars including sugary drinks/foods and processed meats including bacon, sausages and deli meats.

## 2023-11-24 NOTE — Assessment & Plan Note (Addendum)
 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 Hydroxyzine  used as needed for sleep. - Send prescription for hydroxyzine  to pharmacy. - Practice good sleep hygiene.

## 2023-11-24 NOTE — Assessment & Plan Note (Signed)
 She was congratulated on her 24 pound loss since July. Wegovy  effective in appetite suppression. Goal weight 220 pounds. - Send prescription for We[facility]y  2.4 mg to [facility]. - Continue strength training on Tuesdays and Thursdays. - Continue cardio exercises on Monday, Wednesday, and Friday.

## 2024-01-27 ENCOUNTER — Ambulatory Visit (AMBULATORY_SURGERY_CENTER)

## 2024-01-27 VITALS — Ht 65.0 in | Wt 243.4 lb

## 2024-01-27 DIAGNOSIS — Z860100 Personal history of colon polyps, unspecified: Secondary | ICD-10-CM

## 2024-01-27 MED ORDER — PEG 3350-KCL-NA BICARB-NACL 420 G PO SOLR: 4000.0000 mL | Freq: Once | ORAL | 0 refills | Status: AC

## 2024-01-27 NOTE — Progress Notes (Signed)
 PCP MD at time of PV: Catheryn Slocumb, MD __________________________________________________________________________________________________________________________________________  No egg allergy known to patient  No soy allergy known to patient No issues known to pt with past sedation with any surgeries or procedures Patient denies ever being told they had issues or difficulty with intubation  No FH of Malignant Hyperthermia Pt is not on diet pills Pt is not on  home 02  Pt is not on blood thinners  No A fib or A flutter Have any cardiac testing pending-- no  LOA: independent  No Chew or Snuff tobacco __________________________________________________________________________________________________________________________________________  Constipation: no  Prep: Golytely  __________________________________________________________________________________________________________________________________________  PV completed with patient. Prep instructions reviewed and provided during apt. Rx sent to preferred pharmacy.  __________________________________________________________________________________________________________________________________________  Patient's chart reviewed by Norleen Schillings CNRA prior to previsit and patient appropriate for the [facility].  Previsit completed and red dot placed by patient's name on their procedure day (on provider's schedule).

## 2024-01-31 ENCOUNTER — Encounter: Payer: Self-pay | Admitting: Gastroenterology

## 2024-02-11 ENCOUNTER — Encounter: Payer: Self-pay | Admitting: Gastroenterology

## 2024-02-11 ENCOUNTER — Ambulatory Visit: Admitting: Gastroenterology

## 2024-02-11 VITALS — BP 130/74 | HR 65 | Temp 97.9°F | Resp 16 | Ht 65.0 in | Wt <= 1120 oz

## 2024-02-11 DIAGNOSIS — Z860100 Personal history of colon polyps, unspecified: Secondary | ICD-10-CM | POA: Diagnosis not present

## 2024-02-11 DIAGNOSIS — K648 Other hemorrhoids: Secondary | ICD-10-CM | POA: Diagnosis not present

## 2024-02-11 DIAGNOSIS — Z860101 Personal history of adenomatous and serrated colon polyps: Secondary | ICD-10-CM | POA: Diagnosis not present

## 2024-02-11 DIAGNOSIS — K6389 Other specified diseases of intestine: Secondary | ICD-10-CM

## 2024-02-11 DIAGNOSIS — Z1211 Encounter for screening for malignant neoplasm of colon: Secondary | ICD-10-CM

## 2024-02-11 DIAGNOSIS — D124 Benign neoplasm of descending colon: Secondary | ICD-10-CM

## 2024-02-11 MED ORDER — SODIUM CHLORIDE 0.9 % IV SOLN: 500.0000 mL | Freq: Once | INTRAVENOUS | Status: DC

## 2024-02-11 NOTE — Progress Notes (Signed)
 [facility] History and Physical   Primary Care Physician:  Jenny Medici, MD   Reason for Procedure:   History of colon polyps  Plan:    colonoscopy     HPI: Jenny Morales is a 53 y.o. female  here for colonoscopy surveillance - last exam with Dr. Aneita 02/2021 - advanced adenoma removed.   Patient denies any bowel symptoms at this time. No family history of colon cancer known. Otherwise feels well without any cardiopulmonary symptoms.   I have discussed risks / benefits of anesthesia and endoscopic procedure with Jenny Morales and they wish to proceed with the exams as outlined today.   The patient was provided an opportunity to ask questions and all were answered. The patient agreed with the plan.    Past Medical History:  Diagnosis Date   Allergy    Seasonal, pollen   Anxiety    generalized anxiety disorder with a history of panic attatcks   Arthritis    knees   Heart murmur    Per pt, heart mumrmur was from having rheumatic fever as a child. Pt was on PCN for years. In 89 's, pt stated that she no longer had a heart murmur and was taken off the PCN.   History of palpitations 11/30/2016   normal EKG, MD note in Epic  states suspected underlying anxiety   Hypertension 01/11/2021   Insomnia 2022   Obesity    Wears glasses     Past Surgical History:  Procedure Laterality Date   ABDOMINAL HYSTERECTOMY  06/12/2021   COLONOSCOPY  02/13/2021   colon polyps   HYSTERECTOMY ABDOMINAL WITH SALPINGECTOMY Bilateral 06/12/2021   Procedure: TOTAL ABDOMINAL HYSTERECTOMY, BILATERAL SALPINGECTOMY;  Surgeon: Jenny Bouchard, DO;  Location: [facility];  Service: Gynecology;  Laterality: Bilateral;   POLYPECTOMY     SALPINGOOPHORECTOMY Bilateral 06/12/2021   Procedure: OPEN SALPINGO OOPHORECTOMY;  Surgeon: Jenny Bouchard, DO;  Location: [facility] ;  Service: Gynecology;  Laterality: Bilateral;   TONSILLECTOMY AND ADENOIDECTOMY      When pt was around 53 years old.    Prior to Admission medications   Medication Sig Start Date End Date Taking? Authorizing Provider  amLODipine  (NORVASC ) 10 MG tablet Take 1 tablet (10 mg total) by mouth daily. 10/28/23  Yes Jenny Medici, MD  Cholecalciferol (VITAMIN D3) 125 MCG (5000 UT) CAPS Take by mouth.   Yes [provider]  Ginkgo Biloba 40 MG TABS Take 1 tablet by mouth daily.   Yes [provider]  hydrOXYzine  (ATARAX ) 25 MG tablet Take 1 tablet (25 mg total) by mouth at bedtime as needed for anxiety. 10/15/22  Yes Jenny Suzann HERO, MD  meloxicam (MOBIC) 15 MG tablet Take 15 mg by mouth daily.   Yes [provider]  Omega-3 Fatty Acids (FISH OIL) 1000 MG CAPS Take by mouth.   Yes [provider]  Diclofenac  Sodium 1 % CREA Apply 4 times daily to affected area Patient not taking: No sig reported 11/14/22   Jenny Suzann HERO, MD  ibuprofen  (ADVIL ) 600 MG tablet Take 1 tablet by mouth every 8 (eight) hours as needed. Patient not taking: No sig reported    [provider]  semaglutide -weight management (WEGOVY ) 2.4 MG/0.75ML SOAJ SQ injection Inject 2.4 mg into the skin once a week. 11/20/23   Jenny Medici, MD    Current Outpatient Medications  Medication Sig Dispense Refill   amLODipine  (NORVASC ) 10 MG tablet Take 1 tablet (10 mg  total) by mouth daily. 90 tablet 3   Cholecalciferol (VITAMIN D3) 125 MCG (5000 UT) CAPS Take by mouth.     Ginkgo Biloba 40 MG TABS Take 1 tablet by mouth daily.     hydrOXYzine  (ATARAX ) 25 MG tablet Take 1 tablet (25 mg total) by mouth at bedtime as needed for anxiety. 90 tablet 3   meloxicam (MOBIC) 15 MG tablet Take 15 mg by mouth daily.     Omega-3 Fatty Acids (FISH OIL) 1000 MG CAPS Take by mouth.     Diclofenac  Sodium 1 % CREA Apply 4 times daily to affected area (Patient not taking: No sig reported) 120 g 3   ibuprofen  (ADVIL ) 600 MG tablet Take 1 tablet by mouth every 8 (eight) hours as needed. (Patient not  taking: No sig reported)     semaglutide -weight management (WEGOVY ) 2.4 MG/0.75ML SOAJ SQ injection Inject 2.4 mg into the skin once a week. 3 mL 3   Current Facility-Administered Medications  Medication Dose Route Frequency Provider Last Rate Last Admin   0.9 %  sodium chloride  infusion  500 mL Intravenous Once Jenny Morales, Jenny SQUIBB, MD        Allergies as of 02/11/2024   (No Known Allergies)    Family History  Problem Relation Age of Onset   Multiple myeloma Mother 32       died at age 18   Cancer Mother    Drug abuse Father        died at age 19. also had cancer    Cancer Father        unknown kind    Brain cancer Other        neice   Breast cancer Neg Hx    Colon cancer Neg Hx    Colon polyps Neg Hx    Esophageal cancer Neg Hx    Stomach cancer Neg Hx    Rectal cancer Neg Hx    Cancer - Colon Neg Hx    Ovarian cancer Neg Hx    Endometrial cancer Neg Hx    Pancreatic cancer Neg Hx    Prostate cancer Neg Hx     Social History   Socioeconomic History   Marital status: Married    Spouse name: Not on file   Number of children: Not on file   Years of education: Not on file   Highest education level: Bachelor's degree (e.g., BA, AB, BS)  Occupational History   Not on file  Tobacco Use   Smoking status: Former    Current packs/day: 0.00    Average packs/day: 0.8 packs/day for 23.0 years (17.3 ttl pk-yrs)    Types: Cigarettes    Start date: 03/12/1986    Quit date: 2011    Years since quitting: 14.9   Smokeless tobacco: Never  Vaping Use   Vaping status: Never Used  Substance and Sexual Activity   Alcohol use: Yes    Alcohol/week: 5.0 standard drinks of alcohol    Comment: 3 to 5 glasses of wine, occasional liquor   Drug use: Not Currently    Types: Marijuana    Comment: Smokes marijuana about 2 or 3 times per week as of 05/30/21, last use 06/10/21   Sexual activity: Yes    Partners: Female    Birth control/protection: Post-menopausal  Other Topics Concern    Not on file  Social History Narrative   Works as oncologist   Wife is partner   Her wife is a visual merchandiser  the patient is a production designer, 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   Social Drivers of [employer]: Low Risk  (11/19/2023)   Overall Financial Resource Strain (CARDIA)    Difficulty of Paying Living Expenses: Not hard at all  Food Insecurity: No Food Insecurity (11/19/2023)   Hunger Vital Sign    Worried About Running Out of Food in the Last Year: Never true    Ran Out of Food in the Last Year: Never true  Transportation Needs: No Transportation Needs (11/19/2023)   PRAPARE - Administrator, Civil Service (Medical): No    Lack of Transportation (Non-Medical): No  Physical Activity: Sufficiently Active (11/19/2023)   Exercise Vital Sign    Days of Exercise per Week: 4 days    Minutes of Exercise per Session: 40 min  Stress: No Stress Concern Present (11/19/2023)   [employer] of Occupational Health - Occupational Stress Questionnaire    Feeling of Stress: Not at all  Social Connections: Moderately Isolated (11/19/2023)   Social Connection and Isolation Panel    Frequency of Communication with Friends and Family: Once a week    Frequency of Social Gatherings with Friends and Family: Once a week    Attends Religious Services: 1 to 4 times per year    Active Member of [employer] or Organizations: No    Attends Engineer, Structural: Not on file    Marital Status: Married  Catering Manager Violence: Not on file    Review of Systems: All other review of systems negative except as mentioned in the HPI.  Physical Exam: Vital signs BP 124/68   Pulse 62   Temp 97.9 F (36.6 C) (Temporal)   Ht 5' 5 (1.651 m)   Wt 23 lb (10.4 kg)   LMP 06/07/2021 (Exact Date)   SpO2 98%   BMI 3.83 kg/m   General:   Alert,  Well-developed, pleasant and cooperative in NAD Lungs:  Clear throughout to auscultation.   Heart:  Regular rate and rhythm Abdomen:  Soft, nontender  and nondistended.   Neuro/Psych:  Alert and cooperative. Normal mood and affect. A and O x 3  Marcey Naval, MD [facility]

## 2024-02-11 NOTE — Op Note (Signed)
 [facility] Patient Name: Jenny Morales Procedure Date: 02/11/2024 7:59 AM MRN: [id number] Endoscopist: Elspeth P. Leigh , MD, [id number] Age: 53 Referring MD:  Date of Birth: March 19, 1970 Gender: Female Account #: [id number] Procedure:                Colonoscopy Indications:              High risk colon cancer surveillance: Personal                            history of colonic polyps - advanced adenoma                            removed 02/2021 - Dr. Aneita Medicines:                Monitored Anesthesia Care Procedure:                Pre-Anesthesia Assessment:                           - Prior to the procedure, a History and Physical                            was performed, and patient medications and                            allergies were reviewed. The patient's tolerance of                            previous anesthesia was also reviewed. The risks                            and benefits of the procedure and the sedation                            options and risks were discussed with the patient.                            All questions were answered, and informed consent                            was obtained. Prior Anticoagulants: The patient has                            taken no anticoagulant or antiplatelet agents. ASA                            Grade Assessment: III - A patient with severe                            systemic disease. After reviewing the risks and                            benefits, the patient was deemed in satisfactory  condition to undergo the procedure.                           After obtaining informed consent, the colonoscope                            was passed under direct vision. Throughout the                            procedure, the patient's blood pressure, pulse, and                            oxygen saturations were monitored continuously. The                            Colonoscope was introduced  through the anus and                            advanced to the the cecum, identified by                            appendiceal orifice and ileocecal valve. The                            colonoscopy was performed without difficulty. The                            patient tolerated the procedure well. The quality                            of the bowel preparation was good. The ileocecal                            valve, appendiceal orifice, and rectum were                            photographed. Scope In: 8:01:25 AM Scope Out: 8:18:11 AM Scope Withdrawal Time: 0 hours 12 minutes 48 seconds  Total Procedure Duration: 0 hours 16 minutes 46 seconds  Findings:                 The perianal and digital rectal examinations were                            normal.                           A 3 mm polyp was found in the descending colon. The                            polyp was sessile. The polyp was removed with a                            cold snare. Resection and retrieval were complete.  Internal hemorrhoids were found during                            retroflexion, with scarring (suspect from prior                            banding)                           The exam was otherwise without abnormality. Complications:            No immediate complications. Estimated blood loss:                            Minimal. Estimated Blood Loss:     Estimated blood loss was minimal. Impression:               - One 3 mm polyp in the descending colon, removed                            with a cold snare. Resected and retrieved.                           - Internal hemorrhoids.                           - The examination was otherwise normal. Recommendation:           - Patient has a contact number available for                            emergencies. The signs and symptoms of potential                            delayed complications were discussed with the                             patient. Return to normal activities tomorrow.                            Written discharge instructions were provided to the                            patient.                           - Resume previous diet.                           - Continue present medications.                           - Await pathology results. Repeat colonoscopy in 5                            years given history of advanced polyp removed on  the last exam. Elspeth P. Cyris Maalouf, MD 02/11/2024 8:24:37 AM This report has been signed electronically.

## 2024-02-11 NOTE — Progress Notes (Signed)
 Pt A/O x 3, gd SR's, pleased with anesthesia, report to RN

## 2024-02-11 NOTE — Patient Instructions (Signed)
 Resume previous diet. Continue present medications. Awaiting pathology results.  Repeat colonoscopy in 5 years given history of advanced polyp removed on the last exam. Handouts provided on polyps and hemorrhoids.  YOU HAD AN ENDOSCOPIC PROCEDURE TODAY AT THE [facility]:   Refer to the procedure report that was given to you for any specific questions about what was found during the examination.  If the procedure report does not answer your questions, please call your gastroenterologist to clarify.  If you requested that your care partner not be given the details of your procedure findings, then the procedure report has been included in a sealed envelope for you to review at your convenience later.  YOU SHOULD EXPECT: Some feelings of bloating in the abdomen. Passage of more gas than usual.  Walking can help get rid of the air that was put into your GI tract during the procedure and reduce the bloating. If you had a lower endoscopy (such as a colonoscopy or flexible sigmoidoscopy) you may notice spotting of blood in your stool or on the toilet paper. If you underwent a bowel prep for your procedure, you may not have a normal bowel movement for a few days.  Please Note:  You might notice some irritation and congestion in your nose or some drainage.  This is from the oxygen used during your procedure.  There is no need for concern and it should clear up in a day or so.  SYMPTOMS TO REPORT IMMEDIATELY:  Following lower endoscopy (colonoscopy or flexible sigmoidoscopy):  Excessive amounts of blood in the stool  Significant tenderness or worsening of abdominal pains  Swelling of the abdomen that is new, acute  Fever of 100F or higher  For urgent or emergent issues, a gastroenterologist can be reached at any hour by calling [phone]. Do not use MyChart messaging for urgent concerns.    DIET:  We do recommend a small meal at first, but then you may proceed to your regular diet.   Drink plenty of fluids but you should avoid alcoholic beverages for 24 hours.  ACTIVITY:  You should plan to take it easy for the rest of today and you should NOT DRIVE or use heavy machinery until tomorrow (because of the sedation medicines used during the test).    FOLLOW UP: Our staff will call the number listed on your records the next business day following your procedure.  We will call around 7:15- 8:00 am to check on you and address any questions or concerns that you may have regarding the information given to you following your procedure. If we do not reach you, we will leave a message.     If any biopsies were taken you will be contacted by phone or by letter within the next 1-3 weeks.  Please call us  at [phone] if you have not heard about the biopsies in 3 weeks.    SIGNATURES/CONFIDENTIALITY: You and/or your care partner have signed paperwork which will be entered into your electronic medical record.  These signatures attest to the fact that that the information above on your After Visit Summary has been reviewed and is understood.  Full responsibility of the confidentiality of this discharge information lies with you and/or your care-partner.

## 2024-02-11 NOTE — Progress Notes (Signed)
 Pt's states no medical or surgical changes since previsit or office visit.

## 2024-02-12 ENCOUNTER — Encounter: Payer: Self-pay | Admitting: Internal Medicine

## 2024-02-12 ENCOUNTER — Ambulatory Visit: Payer: Self-pay | Admitting: Internal Medicine

## 2024-02-12 ENCOUNTER — Telehealth: Payer: Self-pay | Admitting: *Deleted

## 2024-02-12 VITALS — BP 126/66 | HR 63 | Temp 97.7°F | Ht 65.0 in | Wt 243.0 lb

## 2024-02-12 DIAGNOSIS — Z6841 Body Mass Index (BMI) 40.0 and over, adult: Secondary | ICD-10-CM | POA: Diagnosis not present

## 2024-02-12 DIAGNOSIS — I10 Essential (primary) hypertension: Secondary | ICD-10-CM

## 2024-02-12 DIAGNOSIS — F5101 Primary insomnia: Secondary | ICD-10-CM

## 2024-02-12 DIAGNOSIS — E66813 Obesity, class 3: Secondary | ICD-10-CM | POA: Diagnosis not present

## 2024-02-12 DIAGNOSIS — R7303 Prediabetes: Secondary | ICD-10-CM

## 2024-02-12 MED ORDER — WEGOVY 2.4 MG/0.75ML ~~LOC~~ SOAJ: 2.4000 mg | SUBCUTANEOUS | 3 refills | Status: AC

## 2024-02-12 NOTE — Patient Instructions (Signed)

## 2024-02-12 NOTE — Progress Notes (Signed)
 I,Victoria T Emmitt, CMA,acting as a neurosurgeon for Catheryn LOISE Slocumb, MD.,have documented all relevant documentation on the behalf of Catheryn LOISE Slocumb, MD,as directed by  Catheryn LOISE Slocumb, MD while in the presence of Catheryn LOISE Slocumb, MD.  Subjective:  Patient ID: Jenny Morales , female    DOB: Oct 26, 1970 , 53 y.o.   MRN: [id number]  Chief Complaint  Patient presents with   Obesity    Patient being seen today for weight & bp check. Patient denies andy chest pain SOB or headaches at this time. Patient compliant with all medications. Colonoscopy completed yesterday. GYN added calcium twice daily. 600mg s.     HPI Discussed the use of AI scribe software for clinical note transcription with the patient, who gave verbal consent to proceed.  History of Present Illness Jenny Morales is a 53 year old female who presents for a weight check and follow-up on her weight loss progress.  She has lost approximately 40 to 45 pounds since starting her weight loss journey in April or May of this year. She reports taking her prescribed medication, eating smaller portions, consuming a lot of protein, and engaging in weight training and cardio exercises. Her clothes have become noticeably larger, indicating her weight loss. Her home scale shows a weight of 282.6 pounds, consistent with her recent weight check. She aims to continue losing weight with a goal of reaching 220 pounds.  She recently underwent a colonoscopy, during which a small polyp was found and sent to pathology. She has a history of polyps, which have previously been benign, and is now scheduled for colonoscopies every five years.  Her blood pressure readings at home have been in the range of 116 to 120 mmHg. No dizziness is reported.   Past Medical History:  Diagnosis Date   Allergy    Seasonal, pollen   Anxiety    generalized anxiety disorder with a history of panic attatcks   Arthritis    knees   Heart murmur    Per pt, heart mumrmur  was from having rheumatic fever as a child. Pt was on PCN for years. In 32 's, pt stated that she no longer had a heart murmur and was taken off the PCN.   History of palpitations 11/30/2016   normal EKG, MD note in Epic  states suspected underlying anxiety   Hypertension 01/11/2021   Insomnia 2022   Obesity    Wears glasses      Family History  Problem Relation Age of Onset   Multiple myeloma Mother 10       died at age 30   Cancer Mother    Drug abuse Father        died at age 43. also had cancer    Cancer Father        unknown kind    Brain cancer Other        neice   Breast cancer Neg Hx    Colon cancer Neg Hx    Colon polyps Neg Hx    Esophageal cancer Neg Hx    Stomach cancer Neg Hx    Rectal cancer Neg Hx    Cancer - Colon Neg Hx    Ovarian cancer Neg Hx    Endometrial cancer Neg Hx    Pancreatic cancer Neg Hx    Prostate cancer Neg Hx      Current Outpatient Medications:    amLODipine  (NORVASC ) 10 MG tablet, Take 1 tablet (10 mg total) by  mouth daily., Disp: 90 tablet, Rfl: 3   Cholecalciferol (VITAMIN D3) 125 MCG (5000 UT) CAPS, Take by mouth., Disp: , Rfl:    Ginkgo Biloba 40 MG TABS, Take 1 tablet by mouth daily., Disp: , Rfl:    hydrOXYzine  (ATARAX ) 25 MG tablet, Take 1 tablet (25 mg total) by mouth at bedtime as needed for anxiety., Disp: 90 tablet, Rfl: 3   ibuprofen  (ADVIL ) 600 MG tablet, Take 1 tablet by mouth every 8 (eight) hours as needed., Disp: , Rfl:    meloxicam (MOBIC) 15 MG tablet, Take 15 mg by mouth daily., Disp: , Rfl:    Omega-3 Fatty Acids (FISH OIL) 1000 MG CAPS, Take by mouth., Disp: , Rfl:    Diclofenac  Sodium 1 % CREA, Apply 4 times daily to affected area (Patient not taking: Reported on 02/12/2024), Disp: 120 g, Rfl: 3   semaglutide -weight management (WEGOVY ) 2.4 MG/0.75ML SOAJ SQ injection, Inject 2.4 mg into the skin once a week., Disp: 3 mL, Rfl: 3   No Known Allergies   Review of Systems  Constitutional: Negative.   Respiratory:  Negative.    Cardiovascular: Negative.   Gastrointestinal: Negative.   Neurological: Negative.   Psychiatric/Behavioral: Negative.       Today's Vitals   02/12/24 1610  BP: 126/66  Pulse: 63  Temp: 97.7 F (36.5 C)  SpO2: 98%  Weight: 243 lb (110.2 kg)  Height: 5' 5 (1.651 m)   Body mass index is 40.44 kg/m.  Wt Readings from Last 3 Encounters:  02/12/24 243 lb (110.2 kg)  02/11/24 23 lb (10.4 kg)  01/27/24 243 lb 6.4 oz (110.4 kg)     Objective:  Physical Exam Vitals and nursing note reviewed.  Constitutional:      Appearance: Normal appearance. She is obese.  HENT:     Head: Normocephalic and atraumatic.  Eyes:     Extraocular Movements: Extraocular movements intact.  Cardiovascular:     Rate and Rhythm: Normal rate and regular rhythm.     Heart sounds: Normal heart sounds.  Pulmonary:     Effort: Pulmonary effort is normal.     Breath sounds: Normal breath sounds.  Musculoskeletal:     Cervical back: Normal range of motion.  Skin:    General: Skin is warm.  Neurological:     General: No focal deficit present.     Mental Status: She is alert.  Psychiatric:        Mood and Affect: Mood normal.        Behavior: Behavior normal.         Assessment And Plan:  Class 3 severe obesity due to excess calories without serious comorbidity with body mass index (BMI) of 40.0 to 44.9 in adult [facility]) Assessment & Plan: BMI 40.  Significant weight loss of 40-45 pounds since April. Current weight 282.6 pounds, goal 220 pounds. Engaging in weight management through medication, diet, and exercise. No dizziness, indicating stable blood pressure. - Continue current weight management plan including medication, dietary changes, and exercise. - Monitor weight and adjust medication dosage as needed once goal weight is reached. - Scheduled follow-up weight check in 12 week   Primary hypertension Assessment & Plan: Chronic, well controlled.  Blood pressure well-controlled with  current medication. Home readings in 120s and 160s, indicating effective management. No dizziness or lightheadedness. - Continue current antihypertensive medication regimen. - Monitor blood pressure at home and report any consistent readings in the 90s. - Consider reducing medication dosage if dizziness or  lightheadedness occurs.   Prediabetes Assessment & Plan: Previous labs reviewed, her A1c has been elevated in the past. I will check an A1c today. Reminded to avoid refined sugars including sugary drinks/foods and processed meats including bacon, sausages and deli meats.     Primary insomnia Assessment & Plan: Insomnia due to hot flashes. Hydroxyzine  used as needed for sleep. - She will continue with hydroxyzine  to use as needed.   - Practice good sleep hygiene.   Other orders -     Wegovy ; Inject 2.4 mg into the skin once a week.  Dispense: 3 mL; Refill: 3   Return in 12 weeks (on 05/06/2024), or weight check f/u [facility] .  Patient was given opportunity to ask questions. Patient verbalized understanding of the plan and was able to repeat key elements of the plan. All questions were answered to their satisfaction.   I, Catheryn LOISE Slocumb, MD, have reviewed all documentation for this visit. The documentation on 02/12/24 for the exam, diagnosis, procedures, and orders are all accurate and complete.   IF YOU HAVE BEEN REFERRED TO A SPECIALIST, IT MAY TAKE 1-2 WEEKS TO SCHEDULE/PROCESS THE REFERRAL. IF YOU HAVE NOT HEARD FROM US /SPECIALIST IN TWO WEEKS, PLEASE GIVE US  A CALL AT [phone] X [phone].   THE PATIENT IS ENCOURAGED TO PRACTICE SOCIAL DISTANCING DUE TO THE COVID-19 PANDEMIC.

## 2024-02-12 NOTE — Telephone Encounter (Signed)
  Follow up Call-     02/11/2024    7:14 AM  Call back number  Post procedure Call Back phone  # [phone]  Permission to leave phone message Yes     Patient questions:  Do you have a fever, pain , or abdominal swelling? No. Pain Score  0 *  Have you tolerated food without any problems? Yes.    Have you been able to return to your normal activities? Yes.    Do you have any questions about your discharge instructions: Diet   No. Medications  No. Follow up visit  No.  Do you have questions or concerns about your Care? No.  Actions: * If pain score is 4 or above: No action needed, pain <4.

## 2024-02-13 LAB — SURGICAL PATHOLOGY

## 2024-02-16 NOTE — Assessment & Plan Note (Signed)
 Chronic, well controlled.  Blood pressure well-controlled with current medication. Home readings in 120s and 160s, indicating effective management. No dizziness or lightheadedness. - Continue current antihypertensive medication regimen. - Monitor blood pressure at home and report any consistent readings in the 90s. - Consider reducing medication dosage if dizziness or lightheadedness occurs.

## 2024-02-16 NOTE — Assessment & Plan Note (Signed)
 BMI 40.  Significant weight loss of 40-45 pounds since April. Current weight 282.6 pounds, goal 220 pounds. Engaging in weight management through medication, diet, and exercise. No dizziness, indicating stable blood pressure. - Continue current weight management plan including medication, dietary changes, and exercise. - Monitor weight and adjust medication dosage as needed once goal weight is reached. - Scheduled follow-up weight check in 12 week

## 2024-02-16 NOTE — Assessment & Plan Note (Signed)
 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 Hydroxyzine  used as needed for sleep. - She will continue with hydroxyzine  to use as needed.   - Practice good sleep hygiene.

## 2024-02-16 NOTE — Assessment & Plan Note (Signed)
 Previous labs reviewed, her A1c has been elevated in the past. I will check an A1c today. Reminded to avoid refined sugars including sugary drinks/foods and processed meats including bacon, sausages and deli meats.

## 2024-02-18 ENCOUNTER — Ambulatory Visit: Payer: Self-pay | Admitting: Gastroenterology

## 2024-05-06 ENCOUNTER — Ambulatory Visit: Admitting: Internal Medicine

## 2024-11-26 ENCOUNTER — Encounter: Payer: Self-pay | Admitting: Internal Medicine
